# Patient Record
Sex: Female | Born: 1975 | Race: Black or African American | Hispanic: No | Marital: Married | Smoking: Current some day smoker
Health system: Southern US, Community
[De-identification: ages and names within clinical notes are randomized; demographics above are authoritative.]

## PROBLEM LIST (undated history)

## (undated) DIAGNOSIS — D649 Anemia, unspecified: Secondary | ICD-10-CM

## (undated) DIAGNOSIS — F419 Anxiety disorder, unspecified: Secondary | ICD-10-CM

## (undated) DIAGNOSIS — Z8489 Family history of other specified conditions: Secondary | ICD-10-CM

## (undated) DIAGNOSIS — J45909 Unspecified asthma, uncomplicated: Secondary | ICD-10-CM

## (undated) HISTORY — PX: APPENDECTOMY: SHX54

## (undated) HISTORY — PX: HERNIA REPAIR: SHX51

## (undated) HISTORY — PX: COLON SURGERY: SHX602

---

## 1998-04-30 HISTORY — PX: ECTOPIC PREGNANCY SURGERY: SHX613

## 2001-01-19 ENCOUNTER — Emergency Department (HOSPITAL_COMMUNITY): Admission: EM | Admit: 2001-01-19 | Discharge: 2001-01-19 | Payer: Self-pay | Admitting: *Deleted

## 2005-01-06 ENCOUNTER — Observation Stay (HOSPITAL_COMMUNITY): Admission: EM | Admit: 2005-01-06 | Discharge: 2005-01-07 | Payer: Self-pay | Admitting: Emergency Medicine

## 2006-01-02 ENCOUNTER — Emergency Department (HOSPITAL_COMMUNITY): Admission: EM | Admit: 2006-01-02 | Discharge: 2006-01-02 | Payer: Self-pay | Admitting: Emergency Medicine

## 2007-02-15 ENCOUNTER — Ambulatory Visit: Payer: Self-pay | Admitting: Obstetrics and Gynecology

## 2007-04-07 ENCOUNTER — Ambulatory Visit: Payer: Self-pay | Admitting: Obstetrics and Gynecology

## 2014-02-19 ENCOUNTER — Ambulatory Visit: Payer: Self-pay | Admitting: Family Medicine

## 2014-04-06 ENCOUNTER — Ambulatory Visit: Payer: Self-pay | Admitting: Specialist

## 2014-04-13 ENCOUNTER — Ambulatory Visit: Payer: Self-pay | Admitting: Family Medicine

## 2014-04-13 ENCOUNTER — Ambulatory Visit: Payer: Self-pay | Admitting: Specialist

## 2014-05-14 ENCOUNTER — Ambulatory Visit: Payer: Self-pay | Admitting: Oncology

## 2014-05-31 ENCOUNTER — Ambulatory Visit: Payer: Self-pay | Admitting: Oncology

## 2014-06-29 ENCOUNTER — Ambulatory Visit
Admit: 2014-06-29 | Disposition: A | Discharge status: Home / Self Care | Payer: Self-pay | Attending: Oncology | Admitting: Oncology

## 2014-07-13 ENCOUNTER — Ambulatory Visit: Payer: Self-pay | Admitting: Specialist

## 2014-07-20 ENCOUNTER — Inpatient Hospital Stay: Payer: Self-pay | Admitting: Specialist

## 2014-07-20 HISTORY — PX: LAPAROSCOPIC GASTRIC SLEEVE RESECTION: SHX5895

## 2014-08-23 LAB — SURGICAL PATHOLOGY

## 2014-08-29 NOTE — Op Note (Signed)
PATIENT NAME:  Catherine Hunter, COZBY MR#:  [id number] DATE OF BIRTH:  08-Dec-1975  DATE OF PROCEDURE:  07/20/2014  PREOPERATIVE DIAGNOSES: Morbid obesity and hiatal hernia.   POSTOPERATIVE DIAGNOSES: Morbid obesity and hiatal hernia.    PROCEDURE: Laparoscopic sleeve gastrectomy and a hiatal hernia repair.   SURGEON: Kreg Shropshire, M.D.   ASSISTANT: Valaria Good, PA-C.   COMPLICATIONS: None.   SPECIMENS: Portion of stomach.   ESTIMATED BLOOD LOSS: None.   ANESTHESIA: General endotracheal.   DRAINS: None.   CLINICAL HISTORY: See H and P.   DETAILS OF PROCEDURE: The patient was taken to the operating room and placed on the operating table in the supine position. Appropriate monitors and supplemental oxygen were delivered. Broad spectrum IV antibiotics were administered. The patient was placed under general anesthesia without incident. Sequential stockings were placed. The abdomen was prepped and draped in the usual sterile fashion.   Access using a 5 mm optical trocar. Pneumoperitoneum was established. Multiple other ports were placed in preparation for the surgery. The hiatus was explored.  A moderate hiatal hernia was evident. This was closed using interrupted 0 Surgidac sutures. Once this was closed, the greater curvature of the stomach was mobilized from 5 cm from the pylorus all the way to the fundus including the fundus off of the left hemidiaphragm.  The posterior pancreatic attachments were removed with the Harmonic scalpel as well, completely freeing up the undersurface of the stomach. A 36-French bougie was inserted into the antrum and the antrum was bisected using an Echelon reinforced green load stapler x 1. Multiple gold loads of Seamguard were used in a fashion parallel to the lesser curvature. The excess stomach was removed through a 15 mm port and sent for pathologic evaluation. The staple line was secure without any evidence of bleeding or leak. The trocars and the bougie were removed  without incident as well as the liver retractor, and the wounds were closed using 4-0 Vicryl and Dermabond.   The patient tolerated the procedure well and arrived to the recovery room in stable condition.  Patient will be admitted for 23 hours.    ____________________________ Kreg Shropshire, MD jb:sp D: 07/20/2014 10:17:26 ET T: 07/20/2014 12:17:00 ET JOB#: [id number]  cc: Kreg Shropshire, MD, <Dictator> Bonner Puna MD ELECTRONICALLY SIGNED 08/23/2014 9:39

## 2014-09-29 ENCOUNTER — Emergency Department: Payer: BLUE CROSS/BLUE SHIELD

## 2014-09-29 ENCOUNTER — Emergency Department
Admission: EM | Admit: 2014-09-29 | Discharge: 2014-09-29 | Disposition: A | Discharge status: Home / Self Care | Payer: BLUE CROSS/BLUE SHIELD | Attending: Emergency Medicine | Admitting: Emergency Medicine

## 2014-09-29 DIAGNOSIS — R112 Nausea with vomiting, unspecified: Secondary | ICD-10-CM | POA: Insufficient documentation

## 2014-09-29 DIAGNOSIS — Z3202 Encounter for pregnancy test, result negative: Secondary | ICD-10-CM | POA: Diagnosis not present

## 2014-09-29 DIAGNOSIS — Z79899 Other long term (current) drug therapy: Secondary | ICD-10-CM | POA: Diagnosis not present

## 2014-09-29 DIAGNOSIS — Z720 Tobacco use: Secondary | ICD-10-CM | POA: Insufficient documentation

## 2014-09-29 DIAGNOSIS — R109 Unspecified abdominal pain: Secondary | ICD-10-CM | POA: Insufficient documentation

## 2014-09-29 HISTORY — DX: Unspecified asthma, uncomplicated: J45.909

## 2014-09-29 HISTORY — DX: Anemia, unspecified: D64.9

## 2014-09-29 LAB — URINALYSIS COMPLETE WITH MICROSCOPIC (ARMC ONLY)
BACTERIA UA: NONE SEEN
BILIRUBIN URINE: NEGATIVE
Glucose, UA: NEGATIVE mg/dL
HGB URINE DIPSTICK: NEGATIVE
Leukocytes, UA: NEGATIVE
NITRITE: NEGATIVE
PH: 5 (ref 5.0–8.0)
PROTEIN: 30 mg/dL — AB
Specific Gravity, Urine: 1.027 (ref 1.005–1.030)

## 2014-09-29 LAB — CBC WITH DIFFERENTIAL/PLATELET
BASOS PCT: 1 %
Basophils Absolute: 0 10*3/uL (ref 0–0.1)
Eosinophils Absolute: 0.2 10*3/uL (ref 0–0.7)
Eosinophils Relative: 6 %
HCT: 34.9 % — ABNORMAL LOW (ref 35.0–47.0)
HEMOGLOBIN: 10.8 g/dL — AB (ref 12.0–16.0)
LYMPHS ABS: 0.6 10*3/uL — AB (ref 1.0–3.6)
Lymphocytes Relative: 21 %
MCH: 24.8 pg — AB (ref 26.0–34.0)
MCHC: 31 g/dL — AB (ref 32.0–36.0)
MCV: 80 fL (ref 80.0–100.0)
MONO ABS: 0.4 10*3/uL (ref 0.2–0.9)
Monocytes Relative: 15 %
NEUTROS ABS: 1.6 10*3/uL (ref 1.4–6.5)
NEUTROS PCT: 57 %
Platelets: 307 10*3/uL (ref 150–440)
RBC: 4.36 MIL/uL (ref 3.80–5.20)
RDW: 17.2 % — ABNORMAL HIGH (ref 11.5–14.5)
WBC: 2.9 10*3/uL — ABNORMAL LOW (ref 3.6–11.0)

## 2014-09-29 LAB — BASIC METABOLIC PANEL
ANION GAP: 7 (ref 5–15)
BUN: 6 mg/dL (ref 6–20)
CALCIUM: 8.9 mg/dL (ref 8.9–10.3)
CHLORIDE: 103 mmol/L (ref 101–111)
CO2: 26 mmol/L (ref 22–32)
Creatinine, Ser: 0.76 mg/dL (ref 0.44–1.00)
Glucose, Bld: 91 mg/dL (ref 65–99)
Potassium: 3.7 mmol/L (ref 3.5–5.1)
SODIUM: 136 mmol/L (ref 135–145)

## 2014-09-29 LAB — POCT PREGNANCY, URINE: PREG TEST UR: NEGATIVE

## 2014-09-29 MED ORDER — SODIUM CHLORIDE 0.9 % IV SOLN
Freq: Once | INTRAVENOUS | Status: AC
  Administered 2014-09-29: 12:00:00 via INTRAVENOUS

## 2014-09-29 MED ORDER — DIAZEPAM 5 MG PO TABS: 5.0000 mg | ORAL_TABLET | Freq: Three times a day (TID) | ORAL | Status: DC | PRN

## 2014-09-29 MED ORDER — ONDANSETRON HCL 4 MG/2ML IJ SOLN
4.0000 mg | Freq: Once | INTRAMUSCULAR | Status: AC
  Administered 2014-09-29: 4 mg via INTRAVENOUS

## 2014-09-29 MED ORDER — ONDANSETRON HCL 4 MG/2ML IJ SOLN
INTRAMUSCULAR | Status: AC
  Administered 2014-09-29: 4 mg via INTRAVENOUS
  Filled 2014-09-29: qty 2

## 2014-09-29 NOTE — ED Notes (Signed)
Pt c/o N/V with intermittent abd cramping for the past 2 days..states she had the gastric sleeve place in march of this year.

## 2014-09-29 NOTE — ED Notes (Signed)
Pt refused wheelchair at time of D/C. Ambulatory to the lobby at this time.

## 2014-09-29 NOTE — ED Notes (Signed)
NAD noted at time of D/C. Pt denies questions or concerns. Pt ambulatory to the lobby at this time.  

## 2014-09-29 NOTE — Discharge Instructions (Signed)

## 2014-09-29 NOTE — ED Provider Notes (Signed)
[facility] Emergency Department Provider Note     Time seen: ----------------------------------------- 11:05 AM on 09/29/2014 -----------------------------------------    I have reviewed the triage vital signs and the nursing notes.   HISTORY  Chief Complaint Nausea and Emesis    HPI Catherine Hunter is a 39 y.o. female since ER for nausea and vomiting with intermittent abdominal cramping for last 2 days. Patient states she had a gastric sleeve in March of this year. Patient states that since that period time she's been eating and drinking fine, no nausea vomiting. Presents with dentist or cramping of the abdomen for the past 2 days and poor by mouth tolerance. Nothing excessive symptoms better, vomiting symptoms worse.     Past Medical History  Diagnosis Date  . Asthma   . Anemia     There are no active problems to display for this patient.   Past Surgical History  Procedure Laterality Date  . Hernia repair    . Laparoscopic gastric sleeve resection      Current Outpatient Rx  Name  Route  Sig  Dispense  Refill  . cholecalciferol (VITAMIN D) 1000 UNITS tablet   Oral   Take 1,000 Units by mouth daily.         . Multiple Vitamins-Minerals (MULTIVITAMIN WITH MINERALS) tablet   Oral   Take 1 tablet by mouth daily.           Allergies Hydrocodone and Oxycodone  No family history on file.  Social History History  Substance Use Topics  . Smoking status: Current Some Day Smoker  . Smokeless tobacco: Never Used  . Alcohol Use: No    Review of Systems Constitutional: Negative for fever. Eyes: Negative for visual changes. ENT: Negative for sore throat. Cardiovascular: Negative for chest pain. Respiratory: Negative for shortness of breath. Gastrointestinal: Positive for abdominal pain and vomiting Genitourinary: Negative for dysuria. Musculoskeletal: Negative for back pain. Skin: Negative for rash. Neurological: Negative  for headaches, focal weakness or numbness.  10-point ROS otherwise negative.  ____________________________________________   PHYSICAL EXAM:  VITAL SIGNS: ED Triage Vitals  Enc Vitals Group     BP 09/29/14 1058 146/91 mmHg     Pulse Rate 09/29/14 1058 58     Resp 09/29/14 0829 20     Temp 09/29/14 0829 98.5 F (36.9 C)     Temp Source 09/29/14 0829 Oral     SpO2 09/29/14 0829 99 %     Weight 09/29/14 0829 172 lb (78.019 kg)     Height 09/29/14 0829 5\' 1"  (1.549 m)     Head Cir --      Peak Flow --      Pain Score 09/29/14 0841 2     Pain Loc --      Pain Edu? --      Excl. in [place]? --     Constitutional: Alert and oriented. Well appearing and in no distress. Eyes: Conjunctivae are normal. PERRL. Normal extraocular movements. ENT   Head: Normocephalic and atraumatic.   Nose: No congestion/rhinnorhea.   Mouth/Throat: Mucous membranes are moist.   Neck: No stridor. Hematological/Lymphatic/Immunilogical: No cervical lymphadenopathy. Cardiovascular: Normal rate, regular rhythm. Normal and symmetric distal pulses are present in all extremities. No murmurs, rubs, or gallops. Respiratory: Normal respiratory effort without tachypnea nor retractions. Breath sounds are clear and equal bilaterally. No wheezes/rales/rhonchi. Gastrointestinal: Soft and nontender. No distention. No abdominal bruits. There is no CVA tenderness. Musculoskeletal: Nontender with normal range of motion in  all extremities. No joint effusions.  No lower extremity tenderness nor edema. Neurologic:  Normal speech and language. No gross focal neurologic deficits are appreciated. Speech is normal. No gait instability. Skin:  Skin is warm, dry and intact. No rash noted. Psychiatric: Mood and affect are normal. Speech and behavior are normal. Patient exhibits appropriate insight and judgment.  ____________________________________________    LABS (pertinent positives/negatives)  Labs Reviewed  CBC WITH  DIFFERENTIAL/PLATELET - Abnormal; Notable for the following:    WBC 2.9 (*)    Hemoglobin 10.8 (*)    HCT 34.9 (*)    MCH 24.8 (*)    MCHC 31.0 (*)    RDW 17.2 (*)    Lymphs Abs 0.6 (*)    All other components within normal limits  URINALYSIS COMPLETEWITH MICROSCOPIC ([facility] ONLY) - Abnormal; Notable for the following:    Color, Urine AMBER (*)    APPearance CLEAR (*)    Ketones, ur 1+ (*)    Protein, ur 30 (*)    Squamous Epithelial / LPF 6-30 (*)    All other components within normal limits  BASIC METABOLIC PANEL  POC URINE PREG, ED  POCT PREGNANCY, URINE   ____________________________________________  ED COURSE:  Pertinent labs & imaging results that were available during my care of the patient were reviewed by me and considered in my medical decision making (see chart for details). We'll check abdominal labs as well as plain films. Likely unrelated to the gastric sleeve.  ____________________________________________   RADIOLOGY  Abdomen flat and erect is unremarkable FINDINGS: Staple line is present in the left upper quadrant related to prior sleeve gastrectomy. Gas is present in nondilated loops of small and large bowel throughout the abdomen without evidence of obstruction. Small amount of left-sided colonic stool is noted. Surgical clips and phleboliths are present in the pelvis. There is a 4.5 cm oval calcified lesion in the right hemipelvis. Slight S-shaped thoracolumbar scoliosis is noted.  IMPRESSION: 1. Nonobstructed bowel-gas pattern. 2. 4.5 cm partially calcified lesion in the right pelvis, indeterminate. Uterine fibroid or adnexal lesion are possible, and this could be further evaluated with nonemergent pelvic ultrasound or CT. Other considerations include changes related to prior surgery, such as a calcified fluid collection, or vascular/aneurysmal or overlying superficial soft  tissue calcification. ____________________________________________    FINAL ASSESSMENT AND PLAN  Abdominal pain and vomiting   Plan: Patient received saline hydration as well as Zofran. Labs are grossly unremarkable, stable for outpatient follow-up.    Earleen Newport, MD   Earleen Newport, MD 09/29/14 [phone]

## 2014-11-22 LAB — CBC WITH DIFFERENTIAL/PLATELET
BASOS ABS: 0 10*3/uL (ref 0.0–0.1)
BASOS PCT: 0.1 %
EOS ABS: 0 10*3/uL (ref 0.0–0.7)
EOS PCT: 0.2 %
HCT: 24.7 % — AB (ref 35.0–47.0)
HGB: 7.6 g/dL — AB (ref 12.0–16.0)
LYMPHS ABS: 0.9 10*3/uL — AB (ref 1.0–3.6)
Lymphocyte %: 13.1 %
MCH: 25.4 pg — ABNORMAL LOW (ref 26.0–34.0)
MCHC: 30.7 g/dL — ABNORMAL LOW (ref 32.0–36.0)
MCV: 83 fL (ref 80–100)
MONOS PCT: 10.1 %
Monocyte #: 0.7 x10 3/mm (ref 0.2–0.9)
Neutrophil #: 5.5 10*3/uL (ref 1.4–6.5)
Neutrophil %: 76.5 %
PLATELETS: 308 10*3/uL (ref 150–440)
RBC: 2.99 10*6/uL — AB (ref 3.80–5.20)
RDW: 17.4 % — AB (ref 11.5–14.5)
WBC: 7.2 10*3/uL (ref 3.6–11.0)

## 2014-11-22 LAB — BASIC METABOLIC PANEL
Anion Gap: 6 — ABNORMAL LOW (ref 7–16)
BUN: 7 mg/dL
Calcium, Total: 8.8 mg/dL — ABNORMAL LOW
Chloride: 103 mmol/L
Co2: 28 mmol/L
Creatinine: 0.73 mg/dL
EGFR (Non-African Amer.): >60
GLUCOSE: 109 mg/dL — AB
POTASSIUM: 3.7 mmol/L
Sodium: 137 mmol/L

## 2014-11-22 LAB — MAGNESIUM: Magnesium: 1.7 mg/dL

## 2014-11-22 LAB — PHOSPHORUS: PHOSPHORUS: 4 mg/dL

## 2014-12-01 ENCOUNTER — Emergency Department
Admission: EM | Admit: 2014-12-01 | Discharge: 2014-12-01 | Disposition: A | Discharge status: Home / Self Care | Payer: BLUE CROSS/BLUE SHIELD | Attending: Emergency Medicine | Admitting: Emergency Medicine

## 2014-12-01 ENCOUNTER — Encounter: Payer: Self-pay | Admitting: Emergency Medicine

## 2014-12-01 DIAGNOSIS — R112 Nausea with vomiting, unspecified: Secondary | ICD-10-CM | POA: Diagnosis present

## 2014-12-01 DIAGNOSIS — Z3202 Encounter for pregnancy test, result negative: Secondary | ICD-10-CM | POA: Insufficient documentation

## 2014-12-01 DIAGNOSIS — Z79899 Other long term (current) drug therapy: Secondary | ICD-10-CM | POA: Diagnosis not present

## 2014-12-01 DIAGNOSIS — R110 Nausea: Secondary | ICD-10-CM | POA: Diagnosis not present

## 2014-12-01 DIAGNOSIS — Z720 Tobacco use: Secondary | ICD-10-CM | POA: Diagnosis not present

## 2014-12-01 DIAGNOSIS — E8889 Other specified metabolic disorders: Secondary | ICD-10-CM | POA: Diagnosis not present

## 2014-12-01 LAB — URINALYSIS COMPLETE WITH MICROSCOPIC (ARMC ONLY)
BILIRUBIN URINE: NEGATIVE
GLUCOSE, UA: NEGATIVE mg/dL
LEUKOCYTES UA: NEGATIVE
Nitrite: NEGATIVE
PH: 5 (ref 5.0–8.0)
Protein, ur: 30 mg/dL — AB
Specific Gravity, Urine: 1.025 (ref 1.005–1.030)

## 2014-12-01 LAB — COMPREHENSIVE METABOLIC PANEL
ALBUMIN: 3.4 g/dL — AB (ref 3.5–5.0)
ALK PHOS: 85 U/L (ref 38–126)
ALT: 15 U/L (ref 14–54)
ANION GAP: 10 (ref 5–15)
AST: 18 U/L (ref 15–41)
BILIRUBIN TOTAL: 0.4 mg/dL (ref 0.3–1.2)
BUN: 7 mg/dL (ref 6–20)
CHLORIDE: 107 mmol/L (ref 101–111)
CO2: 24 mmol/L (ref 22–32)
Calcium: 8.4 mg/dL — ABNORMAL LOW (ref 8.9–10.3)
Creatinine, Ser: 0.56 mg/dL (ref 0.44–1.00)
GFR calc Af Amer: >60 mL/min (ref >60)
GLUCOSE: 95 mg/dL (ref 65–99)
Potassium: 3.4 mmol/L — ABNORMAL LOW (ref 3.5–5.1)
SODIUM: 141 mmol/L (ref 135–145)
Total Protein: 7.3 g/dL (ref 6.5–8.1)

## 2014-12-01 LAB — CBC WITH DIFFERENTIAL/PLATELET
BASOS ABS: 0 10*3/uL (ref 0–0.1)
Basophils Relative: 1 %
EOS ABS: 0 10*3/uL (ref 0–0.7)
EOS PCT: 1 %
HCT: 30.7 % — ABNORMAL LOW (ref 35.0–47.0)
HEMOGLOBIN: 9.6 g/dL — AB (ref 12.0–16.0)
LYMPHS ABS: 0.3 10*3/uL — AB (ref 1.0–3.6)
Lymphocytes Relative: 12 %
MCH: 24.1 pg — AB (ref 26.0–34.0)
MCHC: 31.3 g/dL — AB (ref 32.0–36.0)
MCV: 76.9 fL — ABNORMAL LOW (ref 80.0–100.0)
MONO ABS: 0.2 10*3/uL (ref 0.2–0.9)
Monocytes Relative: 8 %
NEUTROS ABS: 1.7 10*3/uL (ref 1.4–6.5)
NEUTROS PCT: 78 %
PLATELETS: 305 10*3/uL (ref 150–440)
RBC: 3.99 MIL/uL (ref 3.80–5.20)
RDW: 17.8 % — AB (ref 11.5–14.5)
WBC: 2.2 10*3/uL — AB (ref 3.6–11.0)

## 2014-12-01 LAB — PREGNANCY, URINE: Preg Test, Ur: NEGATIVE

## 2014-12-01 LAB — LIPASE, BLOOD: Lipase: 37 U/L (ref 22–51)

## 2014-12-01 MED ORDER — ONDANSETRON HCL 4 MG/2ML IJ SOLN
4.0000 mg | Freq: Once | INTRAMUSCULAR | Status: AC
  Administered 2014-12-01: 4 mg via INTRAVENOUS
  Filled 2014-12-01: qty 2

## 2014-12-01 MED ORDER — DEXTROSE 5 % AND 0.9 % NACL IV BOLUS
500.0000 mL | Freq: Once | INTRAVENOUS | Status: AC
  Administered 2014-12-01: 500 mL via INTRAVENOUS
  Filled 2014-12-01: qty 500

## 2014-12-01 MED ORDER — ONDANSETRON HCL 4 MG PO TABS: 4.0000 mg | ORAL_TABLET | Freq: Every day | ORAL | Status: DC | PRN

## 2014-12-01 NOTE — ED Notes (Signed)
Patient had gastric sleeve surgery 3/22/6.  Patient states she has been vomiting since 4 am but hasn't been able to eat regularly since last Friday.

## 2014-12-01 NOTE — ED Provider Notes (Signed)
[facility] Emergency Department Provider Note  ____________________________________________  Time seen: 64  I have reviewed the triage vital signs and the nursing notes.   HISTORY  Chief Complaint Emesis     HPI Catherine Hunter is a 39 y.o. female who had a gastric sleeve placed in March of this year. She has been doing well until this past Friday when she began to have nausea and vomiting. The nausea and vomiting has continued. She has not been able to eat well over the past few days. She is not having any upper abdominal pain. She denies any fever. She has not had any diarrhea. Her last bowel movement was 2 days ago but she has been flatulent in her normal manner. She is having some lower abdominal discomfort, she reports, from her current menses.    Past Medical History  Diagnosis Date  . Asthma   . Anemia     There are no active problems to display for this patient.   Past Surgical History  Procedure Laterality Date  . Hernia repair    . Laparoscopic gastric sleeve resection      Current Outpatient Rx  Name  Route  Sig  Dispense  Refill  . cholecalciferol (VITAMIN D) 1000 UNITS tablet   Oral   Take 1,000 Units by mouth daily.         . Multiple Vitamins-Minerals (MULTIVITAMIN WITH MINERALS) tablet   Oral   Take 1 tablet by mouth daily.         . ondansetron (ZOFRAN) 4 MG tablet   Oral   Take 1 tablet (4 mg total) by mouth daily as needed for nausea or vomiting.   16 tablet   0     Allergies Hydrocodone and Oxycodone  No family history on file.  Social History History  Substance Use Topics  . Smoking status: Current Some Day Smoker  . Smokeless tobacco: Never Used  . Alcohol Use: Yes    Review of Systems  Constitutional: Negative for fever. ENT: Negative for sore throat. Cardiovascular: Negative for chest pain. Respiratory: Negative for shortness of breath. Gastrointestinal: Nausea and vomiting without  diarrhea. See history of present illness Genitourinary: Negative for dysuria. Positive for menstrual cramps. Musculoskeletal: No myalgias or injuries. Skin: Negative for rash. Neurological: Negative for headaches   10-point ROS otherwise negative.  ____________________________________________   PHYSICAL EXAM:  VITAL SIGNS: ED Triage Vitals  Enc Vitals Group     BP 12/01/14 0906 132/85 mmHg     Pulse Rate 12/01/14 0906 101     Resp 12/01/14 0906 20     Temp 12/01/14 0908 98.1 F (36.7 C)     Temp Source 12/01/14 0908 Oral     SpO2 12/01/14 0906 98 %     Weight 12/01/14 0906 162 lb (73.483 kg)     Height 12/01/14 0906 5\' 5"  (1.651 m)     Head Cir --      Peak Flow --      Pain Score 12/01/14 0917 5     Pain Loc --      Pain Edu? --      Excl. in [place]? --     Constitutional: Alert and oriented. Patient appears a little uncomfortable but no acute distress. ENT   Head: Normocephalic and atraumatic.   Nose: No congestion/rhinnorhea.   Mouth/Throat: Mucous membranes are moist. Cardiovascular: Normal rate, regular rhythm, no murmur noted Respiratory:  Normal respiratory effort, no tachypnea.    Breath  sounds are clear and equal bilaterally.  Gastrointestinal: Soft. Mild tenderness in the lower abdomen due to menstrual cramps per the patient. No tenderness in the upper abdomen. No distention.  Back: No muscle spasm, no tenderness, no CVA tenderness. Musculoskeletal: No deformity noted. Nontender with normal range of motion in all extremities.  No noted edema. Neurologic:  Normal speech and language. No gross focal neurologic deficits are appreciated.  Skin:  Skin is warm, dry. No rash noted. Psychiatric: Mood and affect are normal. Speech and behavior are normal.  ____________________________________________    LABS (pertinent positives/negatives)  Labs Reviewed  CBC WITH DIFFERENTIAL/PLATELET - Abnormal; Notable for the following:    WBC 2.2 (*)    Hemoglobin  9.6 (*)    HCT 30.7 (*)    MCV 76.9 (*)    MCH 24.1 (*)    MCHC 31.3 (*)    RDW 17.8 (*)    Lymphs Abs 0.3 (*)    All other components within normal limits  COMPREHENSIVE METABOLIC PANEL - Abnormal; Notable for the following:    Potassium 3.4 (*)    Calcium 8.4 (*)    Albumin 3.4 (*)    All other components within normal limits  URINALYSIS COMPLETEWITH MICROSCOPIC ([facility] ONLY) - Abnormal; Notable for the following:    Color, Urine YELLOW (*)    APPearance CLEAR (*)    Ketones, ur 1+ (*)    Hgb urine dipstick 2+ (*)    Protein, ur 30 (*)    Bacteria, UA RARE (*)    Squamous Epithelial / LPF 0-5 (*)    All other components within normal limits  LIPASE, BLOOD  PREGNANCY, URINE     ____________________________________________ _______________________   INITIAL IMPRESSION / ASSESSMENT AND PLAN / ED COURSE  Pertinent labs & imaging results that were available during my care of the patient were reviewed by me and considered in my medical decision making (see chart for details).  Pleasant 39 year old female who looks somewhat uncomfortable but in no acute distress, with nausea and vomiting and decreased by mouth intake for the past 5 days. She is also having menstrual cramps. She had a gastric sleeve placed last March. This was done by Dr. Darnell Level here at [place] are pending. We will treat her with Zofran for nausea. The patient and I have agreed that pain medications are not indicated currently.  ----------------------------------------- 11:13 PM on 12/01/2014 -----------------------------------------  Labs are overall reasonable. She does have 1+ ketones in her urine, likely due to the lack of by mouth intake over the last 3-4 days. She has a low white blood cell count at 2.2. Her electrolytes are reasonable.  I have called Dr. Synthia Innocent office and spoke with someone, giving detailed information about the patient's symptoms and concerns today. They have told me that  they will have him call us back.  ----------------------------------------- 1:49 PM on 12/01/2014 -----------------------------------------  Case discussed with Valaria Good, a physician assistant for Dr. Darnell Level. She reports that with no upper abdominal pain and with the gastric sleeve, it would be unlikely to see a complication on CT and a CT is not indicated unless there were other concerns. I do not see any indication for CT given this discussion.  Reassessment of the patient finds her overall comfortable. Her nausea was under control after receiving Zofran earlier, though her nausea is beginning to come back now. We will treat her with more Zofran and have a by mouth challenge.  ----------------------------------------- 3:15 PM on  12/01/2014 -----------------------------------------  Patient feels better. She is tolerating water by mouth, but she is reluctant to try any foods. I've encouraged her to do this and she is now willing to try. I provided her with some applesauce and graham crackers. We'll see how she tolerates this.  ----------------------------------------- 3:41 PM on 12/01/2014 -----------------------------------------  Patient tolerating foods by mouth comfortably. We'll discharge her home. She can follow-up with her regular doctor as well as with Dr. Darnell Level.  ____________________________________________   FINAL CLINICAL IMPRESSION(S) / ED DIAGNOSES  Final diagnoses:  Nausea  Ketosis      Ahmed Prima, MD 12/01/14 [phone]

## 2014-12-01 NOTE — Discharge Instructions (Signed)
It is not clear what is causing your nausea. We have treated with some antinausea medicine. Please continue with Zofran as needed.  We discussed her situation with Dr. Darnell Level as well as with his PA Reather Laurence. Please follow-up with them either this Tuesday here in [place] or in [place] sooner. Return to the emergency department if you have worsening abdominal pain or other urgent concerns.  Nausea and Vomiting Nausea means you feel sick to your stomach. Throwing up (vomiting) is a reflex where stomach contents come out of your mouth. HOME CARE   Take medicine as told by your doctor.  Do not force yourself to eat. However, you do need to drink fluids.  If you feel like eating, eat a normal diet as told by your doctor.  Eat rice, wheat, potatoes, bread, lean meats, yogurt, fruits, and vegetables.  Avoid high-fat foods.  Drink enough fluids to keep your pee (urine) clear or pale yellow.  Ask your doctor how to replace body fluid losses (rehydrate). Signs of body fluid loss (dehydration) include:  Feeling very thirsty.  Dry lips and mouth.  Feeling dizzy.  Dark pee.  Peeing less than normal.  Feeling confused.  Fast breathing or heart rate. GET HELP RIGHT AWAY IF:   You have blood in your throw up.  You have black or bloody poop (stool).  You have a bad headache or stiff neck.  You feel confused.  You have bad belly (abdominal) pain.  You have chest pain or trouble breathing.  You do not pee at least once every 8 hours.  You have cold, clammy skin.  You keep throwing up after 24 to 48 hours.  You have a fever. MAKE SURE YOU:   Understand these instructions.  Will watch your condition.  Will get help right away if you are not doing well or get worse. Document Released: 10/03/2007 Document Revised: 07/09/2011 Document Reviewed: 09/15/2010 [facility] Patient Information 2015 [place], [place]. This information is not intended to replace advice given to you by your  health care provider. Make sure you discuss any questions you have with your health care provider.

## 2014-12-01 NOTE — ED Notes (Signed)
MD at bedside. 

## 2014-12-01 NOTE — ED Notes (Signed)
Pt presents with vomiting since 4am. Pt recently had had gastric sleeve surgery back in March. Denies any pain or diarrhea.

## 2015-10-06 ENCOUNTER — Encounter: Payer: Self-pay | Admitting: Emergency Medicine

## 2015-10-06 ENCOUNTER — Emergency Department
Admission: EM | Admit: 2015-10-06 | Discharge: 2015-10-06 | Disposition: A | Discharge status: Home / Self Care | Payer: BLUE CROSS/BLUE SHIELD | Attending: Student | Admitting: Student

## 2015-10-06 ENCOUNTER — Emergency Department: Payer: BLUE CROSS/BLUE SHIELD

## 2015-10-06 DIAGNOSIS — F17200 Nicotine dependence, unspecified, uncomplicated: Secondary | ICD-10-CM | POA: Insufficient documentation

## 2015-10-06 DIAGNOSIS — K5900 Constipation, unspecified: Secondary | ICD-10-CM | POA: Diagnosis not present

## 2015-10-06 DIAGNOSIS — J45909 Unspecified asthma, uncomplicated: Secondary | ICD-10-CM | POA: Insufficient documentation

## 2015-10-06 DIAGNOSIS — R1031 Right lower quadrant pain: Secondary | ICD-10-CM | POA: Diagnosis present

## 2015-10-06 DIAGNOSIS — D72819 Decreased white blood cell count, unspecified: Secondary | ICD-10-CM

## 2015-10-06 DIAGNOSIS — Z79899 Other long term (current) drug therapy: Secondary | ICD-10-CM | POA: Insufficient documentation

## 2015-10-06 LAB — COMPREHENSIVE METABOLIC PANEL
ALK PHOS: 56 U/L (ref 38–126)
ALT: 12 U/L — AB (ref 14–54)
AST: 15 U/L (ref 15–41)
Albumin: 3.8 g/dL (ref 3.5–5.0)
Anion gap: 7 (ref 5–15)
BILIRUBIN TOTAL: 0.5 mg/dL (ref 0.3–1.2)
BUN: 10 mg/dL (ref 6–20)
CALCIUM: 8.5 mg/dL — AB (ref 8.9–10.3)
CHLORIDE: 109 mmol/L (ref 101–111)
CO2: 23 mmol/L (ref 22–32)
CREATININE: 0.6 mg/dL (ref 0.44–1.00)
Glucose, Bld: 74 mg/dL (ref 65–99)
Potassium: 3.7 mmol/L (ref 3.5–5.1)
Sodium: 139 mmol/L (ref 135–145)
TOTAL PROTEIN: 7.5 g/dL (ref 6.5–8.1)

## 2015-10-06 LAB — URINALYSIS COMPLETE WITH MICROSCOPIC (ARMC ONLY)
BILIRUBIN URINE: NEGATIVE
Bacteria, UA: NONE SEEN
Glucose, UA: NEGATIVE mg/dL
Hgb urine dipstick: NEGATIVE
KETONES UR: NEGATIVE mg/dL
Leukocytes, UA: NEGATIVE
Nitrite: NEGATIVE
PH: 5 (ref 5.0–8.0)
Protein, ur: NEGATIVE mg/dL
Specific Gravity, Urine: 1.019 (ref 1.005–1.030)

## 2015-10-06 LAB — CBC WITH DIFFERENTIAL/PLATELET
BASOS ABS: 0 10*3/uL (ref 0–0.1)
Basophils Relative: 2 %
Eosinophils Absolute: 0.1 10*3/uL (ref 0–0.7)
HEMATOCRIT: 28.8 % — AB (ref 35.0–47.0)
HEMOGLOBIN: 9 g/dL — AB (ref 12.0–16.0)
LYMPHS ABS: 1.1 10*3/uL (ref 1.0–3.6)
MCH: 22.9 pg — AB (ref 26.0–34.0)
MCHC: 31.4 g/dL — ABNORMAL LOW (ref 32.0–36.0)
MCV: 72.8 fL — AB (ref 80.0–100.0)
Monocytes Absolute: 0.5 10*3/uL (ref 0.2–0.9)
Monocytes Relative: 19 %
NEUTROS ABS: 1 10*3/uL — AB (ref 1.4–6.5)
Neutrophils Relative %: 37 %
PLATELETS: 300 10*3/uL (ref 150–440)
RBC: 3.95 MIL/uL (ref 3.80–5.20)
RDW: 18.2 % — ABNORMAL HIGH (ref 11.5–14.5)
WBC: 2.6 10*3/uL — AB (ref 3.6–11.0)

## 2015-10-06 LAB — POCT PREGNANCY, URINE: Preg Test, Ur: NEGATIVE

## 2015-10-06 LAB — LIPASE, BLOOD: LIPASE: 42 U/L (ref 11–51)

## 2015-10-06 MED ORDER — MAGNESIUM CITRATE PO SOLN: 1.0000 | Freq: Once | ORAL | Status: DC

## 2015-10-06 MED ORDER — DIATRIZOATE MEGLUMINE & SODIUM 66-10 % PO SOLN: 15.0000 mL | Freq: Once | ORAL | Status: DC

## 2015-10-06 MED ORDER — ONDANSETRON HCL 4 MG/2ML IJ SOLN
4.0000 mg | Freq: Once | INTRAMUSCULAR | Status: DC
  Filled 2015-10-06: qty 2

## 2015-10-06 MED ORDER — SODIUM CHLORIDE 0.9 % IV BOLUS (SEPSIS): 1000.0000 mL | Freq: Once | INTRAVENOUS | Status: DC

## 2015-10-06 NOTE — ED Provider Notes (Signed)
[facility] Emergency Department Provider Note   ____________________________________________  Time seen: Approximately 8:40 AM  I have reviewed the triage vital signs and the nursing notes.   HISTORY  Chief Complaint Abdominal Pain    HPI Catherine Hunter is a 40 y.o. female with history of asthma, anemia, history of gastric sleeve operation approximately one year ago who presents for evaluation of 3 days of right lower quadrant pain, gradual onset, constant, no modifying factors, currently moderate. No nausea vomiting or diarrhea, she has had constipation. She reports history of constipation which is more severe over the past 2 weeks, only having one small bowel movement in the past 2 weeks despite taking MiraLAX twice a day. No fevers or chills. No chest pain or difficulty breathing. Abdominal surgical history is also significant for C-section as well as hernia repair and she reports surgical repair of a tubal pregnancy. She denies any abnormal vaginal bleeding or vaginal discharge, she denies any concern for sex or chest infection, she is in a mutually monogamous sexual relationship with her husband of several years.   Past Medical History  Diagnosis Date  . Asthma   . Anemia     There are no active problems to display for this patient.   Past Surgical History  Procedure Laterality Date  . Hernia repair    . Laparoscopic gastric sleeve resection      Current Outpatient Rx  Name  Route  Sig  Dispense  Refill  . cholecalciferol (VITAMIN D) 1000 UNITS tablet   Oral   Take 1,000 Units by mouth daily.         . Multiple Vitamins-Minerals (MULTIVITAMIN WITH MINERALS) tablet   Oral   Take 1 tablet by mouth daily.         . ondansetron (ZOFRAN) 4 MG tablet   Oral   Take 1 tablet (4 mg total) by mouth daily as needed for nausea or vomiting.   16 tablet   0     Allergies Hydrocodone and Oxycodone  No family history on file.  Social  History Social History  Substance Use Topics  . Smoking status: Current Some Day Smoker  . Smokeless tobacco: Never Used  . Alcohol Use: Yes    Review of Systems Constitutional: No fever/chills Eyes: No visual changes. ENT: No sore throat. Cardiovascular: Denies chest pain. Respiratory: Denies shortness of breath. Gastrointestinal: + abdominal pain.  No nausea, no vomiting.  No diarrhea.  + constipation. Genitourinary: Negative for dysuria. Musculoskeletal: Negative for back pain. Skin: Negative for rash. Neurological: Negative for headaches, focal weakness or numbness.  10-point ROS otherwise negative.  ____________________________________________   PHYSICAL EXAM:  VITAL SIGNS: ED Triage Vitals  Enc Vitals Group     BP --      Pulse --      Resp --      Temp --      Temp src --      SpO2 --      Weight 10/06/15 0832 143 lb (64.864 kg)     Height 10/06/15 0832 5\' 1"  (1.549 m)     Head Cir --      Peak Flow --      Pain Score 10/06/15 0833 6     Pain Loc --      Pain Edu? --      Excl. in [place]? --     Constitutional: Alert and oriented. Well appearing and in no acute distress. Eyes: Conjunctivae are  normal. PERRL. EOMI. Head: Atraumatic. Nose: No congestion/rhinnorhea. Mouth/Throat: Mucous membranes are moist.  Oropharynx non-erythematous. Neck: No stridor.   Cardiovascular: Normal rate, regular rhythm. Grossly normal heart sounds.  Good peripheral circulation. Respiratory: Normal respiratory effort.  No retractions. Lungs CTAB. Gastrointestinal: Soft With mild tenderness to palpation in the right lower quadrant and right midabdomen, no rebound or guarding. No CVA tenderness. Genitourinary: deferred Musculoskeletal: No lower extremity tenderness nor edema.  No joint effusions. Neurologic:  Normal speech and language. No gross focal neurologic deficits are appreciated. No gait instability. Skin:  Skin is warm, dry and intact. No rash noted. Psychiatric: Mood and  affect are normal. Speech and behavior are normal.  ____________________________________________   LABS (all labs ordered are listed, but only abnormal results are displayed)  Labs Reviewed  CBC WITH DIFFERENTIAL/PLATELET - Abnormal; Notable for the following:    WBC 2.6 (*)    Hemoglobin 9.0 (*)    HCT 28.8 (*)    MCV 72.8 (*)    MCH 22.9 (*)    MCHC 31.4 (*)    RDW 18.2 (*)    Neutro Abs 1.0 (*)    All other components within normal limits  COMPREHENSIVE METABOLIC PANEL - Abnormal; Notable for the following:    Calcium 8.5 (*)    ALT 12 (*)    All other components within normal limits  URINALYSIS COMPLETEWITH MICROSCOPIC ([facility] ONLY) - Abnormal; Notable for the following:    Color, Urine YELLOW (*)    APPearance CLEAR (*)    Squamous Epithelial / LPF 0-5 (*)    All other components within normal limits  LIPASE, BLOOD  POC URINE PREG, ED  POCT PREGNANCY, URINE   ____________________________________________  EKG  none ____________________________________________  RADIOLOGY  3 view abdominal plain films IMPRESSION: No bowel obstruction or free air. Moderate stool in colon. 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 No lung edema or consolidation. ____________________________________________   PROCEDURES  Procedure(s) performed: None  Critical Care performed: No  ____________________________________________   INITIAL IMPRESSION / ASSESSMENT AND PLAN / ED COURSE  Pertinent labs & imaging results that were available during my care of the patient were reviewed by me and considered in my medical decision making (see chart for details).  Catherine Hunter is a 40 y.o. female with history of asthma, anemia, history of gastric sleeve operation approximately one year ago who presents for evaluation of 3 days of right lower quadrant pain. On exam, she is very well-appearing and in no acute distress, vital signs are stable and she is afebrile.** She does have  faint tenderness to palpation throughout the right lower quadrant and the right midabdomen. She denies concern for sexually transmitted infection, appears well clinically and I do not think that this is consistent with torsion or PID. I discussed with her that because of her tenderness in the right lower quadrant, we should obtain a CT scan of the abdomen and pelvis to rule out appendicitis. Initially she agreed to this however she has since changed her mind reporting that she does not want to get the CT scan because she feels as if this is all secondary to constipation, she is claustrophobic and is worried about going to the CT scan though I have offered medications for and she'll ice this, she is concerned about cost. I told her she is well within her right to refuse the CT scan, we will obtain plain films of the abdomen to rule out obstruction and evaluate stool burden and we  are awaiting labs. She has refused any treatment for pain, IV fluids or Zofran at this time.  ----------------------------------------- 11:36 AM on 10/06/2015 ----------------------------------------- Patient continues to appear well. I reviewed her labs. CBC is notable for chronic mild leukopenia and chronic anemia which is generally unchanged from prior. Unremarkable CMP and lipase, negative pregnancy test. Urinalysis is not consistent with infection. 3 view plain films showed no obstruction or free air, moderate stool is noted, there is possibly a calcified uterine leiomyoma in the right pelvis. She reports that she wants to go home. I discussed I would send her home with magnesium citrate but if after she had a good bowel movement, her pain was persistent in the right lower quadrant or she had any additional concerning symptoms, discussed that she would return immediately for CT scan and further evaluation. She voices understanding and is comfortable with the discharge plan. DC  home.  ____________________________________________   FINAL CLINICAL IMPRESSION(S) / ED DIAGNOSES  Final diagnoses:  RLQ abdominal pain  Constipation, unspecified constipation type      NEW MEDICATIONS STARTED DURING THIS VISIT:  New Prescriptions   No medications on file     Note:  This document was prepared using Dragon voice recognition software and may include unintentional dictation errors.    Joanne Gavel, MD 10/06/15 1143

## 2015-10-06 NOTE — ED Notes (Signed)
Pt back from x-ray.

## 2015-10-06 NOTE — ED Notes (Signed)
Pt states abd pain worsening over the past few days, no bm for 2 weeks now, tried miralax with no relief. Had gastric sleeve a year ago.

## 2015-10-06 NOTE — ED Notes (Signed)
Pt reports to this RN that she wants to discuss CT scan with edp. Pt also does not want IV fluids and IV zofran that was ordered until she speaks with the edp. edp notified.

## 2015-10-06 NOTE — ED Notes (Signed)
Patient transported to X-ray 

## 2016-04-28 ENCOUNTER — Inpatient Hospital Stay: Payer: BLUE CROSS/BLUE SHIELD

## 2016-04-28 ENCOUNTER — Emergency Department: Payer: BLUE CROSS/BLUE SHIELD

## 2016-04-28 ENCOUNTER — Inpatient Hospital Stay
Admission: EM | Admit: 2016-04-28 | Discharge: 2016-05-03 | DRG: 336 | Disposition: A | Discharge status: Home / Self Care | Payer: BLUE CROSS/BLUE SHIELD | Attending: Surgery | Admitting: Surgery

## 2016-04-28 ENCOUNTER — Encounter: Payer: Self-pay | Admitting: Radiology

## 2016-04-28 ENCOUNTER — Emergency Department
Admission: EM | Admit: 2016-04-28 | Discharge: 2016-04-28 | Disposition: A | Discharge status: Home / Self Care | Payer: BLUE CROSS/BLUE SHIELD | Source: Home / Self Care

## 2016-04-28 DIAGNOSIS — Z9884 Bariatric surgery status: Secondary | ICD-10-CM | POA: Diagnosis not present

## 2016-04-28 DIAGNOSIS — D259 Leiomyoma of uterus, unspecified: Secondary | ICD-10-CM | POA: Diagnosis present

## 2016-04-28 DIAGNOSIS — K5651 Intestinal adhesions [bands], with partial obstruction: Secondary | ICD-10-CM | POA: Diagnosis present

## 2016-04-28 DIAGNOSIS — J45909 Unspecified asthma, uncomplicated: Secondary | ICD-10-CM

## 2016-04-28 DIAGNOSIS — K56609 Unspecified intestinal obstruction, unspecified as to partial versus complete obstruction: Secondary | ICD-10-CM | POA: Diagnosis present

## 2016-04-28 DIAGNOSIS — F17200 Nicotine dependence, unspecified, uncomplicated: Secondary | ICD-10-CM | POA: Diagnosis present

## 2016-04-28 DIAGNOSIS — R110 Nausea: Secondary | ICD-10-CM | POA: Insufficient documentation

## 2016-04-28 DIAGNOSIS — R188 Other ascites: Secondary | ICD-10-CM | POA: Diagnosis present

## 2016-04-28 DIAGNOSIS — Z885 Allergy status to narcotic agent status: Secondary | ICD-10-CM | POA: Diagnosis not present

## 2016-04-28 DIAGNOSIS — Z5321 Procedure and treatment not carried out due to patient leaving prior to being seen by health care provider: Secondary | ICD-10-CM | POA: Insufficient documentation

## 2016-04-28 DIAGNOSIS — R1084 Generalized abdominal pain: Secondary | ICD-10-CM

## 2016-04-28 DIAGNOSIS — Z4659 Encounter for fitting and adjustment of other gastrointestinal appliance and device: Secondary | ICD-10-CM

## 2016-04-28 HISTORY — DX: Unspecified intestinal obstruction, unspecified as to partial versus complete obstruction: K56.609

## 2016-04-28 LAB — URINALYSIS, COMPLETE (UACMP) WITH MICROSCOPIC
BACTERIA UA: NONE SEEN
BILIRUBIN URINE: NEGATIVE
Glucose, UA: NEGATIVE mg/dL
Hgb urine dipstick: NEGATIVE
KETONES UR: 5 mg/dL — AB
Leukocytes, UA: NEGATIVE
Nitrite: NEGATIVE
Protein, ur: 30 mg/dL — AB
Specific Gravity, Urine: 1.025 (ref 1.005–1.030)
pH: 7 (ref 5.0–8.0)

## 2016-04-28 LAB — CBC
HCT: 28.5 % — ABNORMAL LOW (ref 35.0–47.0)
Hemoglobin: 8.6 g/dL — ABNORMAL LOW (ref 12.0–16.0)
MCH: 20.4 pg — ABNORMAL LOW (ref 26.0–34.0)
MCHC: 30 g/dL — ABNORMAL LOW (ref 32.0–36.0)
MCV: 68 fL — AB (ref 80.0–100.0)
PLATELETS: 363 10*3/uL (ref 150–440)
RBC: 4.19 MIL/uL (ref 3.80–5.20)
RDW: 20.5 % — ABNORMAL HIGH (ref 11.5–14.5)
WBC: 6.2 10*3/uL (ref 3.6–11.0)

## 2016-04-28 LAB — COMPREHENSIVE METABOLIC PANEL
ALBUMIN: 4 g/dL (ref 3.5–5.0)
ALT: 14 U/L (ref 14–54)
AST: 26 U/L (ref 15–41)
Alkaline Phosphatase: 55 U/L (ref 38–126)
Anion gap: 9 (ref 5–15)
BUN: 9 mg/dL (ref 6–20)
CHLORIDE: 104 mmol/L (ref 101–111)
CO2: 21 mmol/L — AB (ref 22–32)
CREATININE: 0.6 mg/dL (ref 0.44–1.00)
Calcium: 9.4 mg/dL (ref 8.9–10.3)
GFR calc Af Amer: >60 mL/min (ref >60)
GLUCOSE: 139 mg/dL — AB (ref 65–99)
Potassium: 3.6 mmol/L (ref 3.5–5.1)
SODIUM: 134 mmol/L — AB (ref 135–145)
Total Bilirubin: 0.5 mg/dL (ref 0.3–1.2)
Total Protein: 7.9 g/dL (ref 6.5–8.1)

## 2016-04-28 LAB — LIPASE, BLOOD: Lipase: 24 U/L (ref 11–51)

## 2016-04-28 LAB — PREGNANCY, URINE: PREG TEST UR: NEGATIVE

## 2016-04-28 MED ORDER — IOPAMIDOL (ISOVUE-300) INJECTION 61%
100.0000 mL | Freq: Once | INTRAVENOUS | Status: AC | PRN
  Administered 2016-04-28: 100 mL via INTRAVENOUS

## 2016-04-28 MED ORDER — PROMETHAZINE HCL 25 MG/ML IJ SOLN
12.5000 mg | Freq: Once | INTRAMUSCULAR | Status: AC
  Administered 2016-04-28: 12.5 mg via INTRAVENOUS
  Filled 2016-04-28: qty 1

## 2016-04-28 MED ORDER — LACTATED RINGERS IV BOLUS (SEPSIS)
1000.0000 mL | Freq: Once | INTRAVENOUS | Status: AC
  Administered 2016-04-28: 1000 mL via INTRAVENOUS

## 2016-04-28 MED ORDER — SODIUM CHLORIDE 0.9 % IV BOLUS (SEPSIS)
1000.0000 mL | Freq: Once | INTRAVENOUS | Status: AC
  Administered 2016-04-28: 1000 mL via INTRAVENOUS

## 2016-04-28 MED ORDER — PHENOL 1.4 % MT LIQD
1.0000 | OROMUCOSAL | Status: DC | PRN
Start: 2016-04-28 — End: 2016-05-03
  Administered 2016-04-28: 1 via OROMUCOSAL
  Filled 2016-04-28: qty 177

## 2016-04-28 MED ORDER — KCL IN DEXTROSE-NACL 20-5-0.45 MEQ/L-%-% IV SOLN
INTRAVENOUS | Status: DC
  Administered 2016-04-28 – 2016-05-02 (×7): via INTRAVENOUS
  Filled 2016-04-28 (×12): qty 1000

## 2016-04-28 MED ORDER — ONDANSETRON HCL 4 MG PO TABS: 4.0000 mg | ORAL_TABLET | Freq: Four times a day (QID) | ORAL | Status: DC | PRN

## 2016-04-28 MED ORDER — IOPAMIDOL (ISOVUE-300) INJECTION 61%
15.0000 mL | INTRAVENOUS | Status: DC
  Administered 2016-04-28: 15 mL via ORAL

## 2016-04-28 MED ORDER — MORPHINE SULFATE (PF) 4 MG/ML IV SOLN
4.0000 mg | INTRAVENOUS | Status: DC | PRN
  Administered 2016-04-28 – 2016-04-29 (×6): 4 mg via INTRAVENOUS
  Filled 2016-04-28 (×6): qty 1

## 2016-04-28 MED ORDER — HEPARIN SODIUM (PORCINE) 5000 UNIT/ML IJ SOLN
5000.0000 [IU] | Freq: Three times a day (TID) | INTRAMUSCULAR | Status: DC
  Administered 2016-04-28 – 2016-05-03 (×16): 5000 [IU] via SUBCUTANEOUS
  Filled 2016-04-28 (×15): qty 1

## 2016-04-28 MED ORDER — DICYCLOMINE HCL 10 MG PO CAPS
ORAL_CAPSULE | ORAL | Status: AC
  Administered 2016-04-28: 10 mg
  Filled 2016-04-28: qty 1

## 2016-04-28 MED ORDER — PROMETHAZINE HCL 25 MG/ML IJ SOLN
12.5000 mg | Freq: Four times a day (QID) | INTRAMUSCULAR | Status: DC | PRN
  Administered 2016-04-28 – 2016-04-29 (×2): 12.5 mg via INTRAVENOUS
  Filled 2016-04-28 (×2): qty 1

## 2016-04-28 MED ORDER — ONDANSETRON HCL 4 MG/2ML IJ SOLN
4.0000 mg | Freq: Four times a day (QID) | INTRAMUSCULAR | Status: DC | PRN
  Administered 2016-04-28 – 2016-04-29 (×2): 4 mg via INTRAVENOUS
  Filled 2016-04-28 (×2): qty 2

## 2016-04-28 NOTE — ED Notes (Signed)
Pt up to first nurse desk, stated "someone's going to need to do something about this pain". Pts resp even and unlabored, skin color WNL.  Told pt that this RN would speak w/ triage RN.

## 2016-04-28 NOTE — ED Triage Notes (Signed)
Pt reports mid abd pain with nausea that started today. Pt reports no diarrhea. Pt reports temp today of 99.7oral. Pt has hx of gastric sleeve approx 17 months ago.

## 2016-04-28 NOTE — Progress Notes (Signed)
Patient gagged and pulled out nasogastric tube. Nasogastric tube is to be replaced by nursing staff at this point.  Patient feels about the same so far has not had an opportunity decompressed with nasogastric tube in place yet. Exam unchanged.  We'll reexamine in the morning with films may need surgical intervention should Catherine Hunter not improve.

## 2016-04-28 NOTE — ED Provider Notes (Signed)
[facility] Emergency Department Provider Note    First MD Initiated Contact with Patient 04/28/16 1035     (approximate)  I have reviewed the triage vital signs and the nursing notes.   HISTORY  Chief Complaint Abdominal Pain    HPI Catherine Hunter is a 40 y.o. female is roughly a year and a half status post gastric sleeve procedure presents with nausea vomiting and epigastric pain that started yesterday. Patient describes the pain as 02/05/2009 in severity. States that she has had some low-grade fevers.  Denies any shortness of breath. Eyes any chest pain. No dysuria. Denies any chance of being pregnant. Denies any vaginal discharge or bleeding.   Past Medical History:  Diagnosis Date  . Anemia   . Asthma    No family history on file. Past Surgical History:  Procedure Laterality Date  . HERNIA REPAIR    . LAPAROSCOPIC GASTRIC SLEEVE RESECTION     Patient Active Problem List   Diagnosis Date Noted  . SBO (small bowel obstruction) 04/28/2016  . Leukopenia 10/06/2015      Prior to Admission medications   Medication Sig Start Date End Date Taking? Authorizing Provider  cholecalciferol (VITAMIN D) 1000 UNITS tablet Take 1,000 Units by mouth daily.   Yes Historical Provider, MD  Multiple Vitamins-Minerals (MULTIVITAMIN WITH MINERALS) tablet Take 1 tablet by mouth daily.   Yes Historical Provider, MD  magnesium citrate SOLN Take 296 mLs (1 Bottle total) by mouth once. Patient not taking: Reported on 04/28/2016 10/06/15   Joanne Gavel, MD  ondansetron (ZOFRAN) 4 MG tablet Take 1 tablet (4 mg total) by mouth daily as needed for nausea or vomiting. 12/01/14   Ahmed Prima, MD    Allergies Hydrocodone and Oxycodone    Social History Social History  Substance Use Topics  . Smoking status: Current Some Day Smoker  . Smokeless tobacco: Never Used  . Alcohol use Yes    Review of Systems Patient denies headaches, rhinorrhea, blurry vision,  numbness, shortness of breath, chest pain, edema, cough, abdominal pain, nausea, vomiting, diarrhea, dysuria, fevers, rashes or hallucinations unless otherwise stated above in HPI. ____________________________________________   PHYSICAL EXAM:  VITAL SIGNS: Vitals:   04/28/16 1055 04/28/16 1426  BP: (!) 154/97 (!) 165/94  Pulse: 83 84  Resp: 18 18  Temp:      Constitutional: Alert and oriented. Well appearing and in no acute distress. Eyes: Conjunctivae are normal. PERRL. EOMI. Head: Atraumatic. Nose: No congestion/rhinnorhea. Mouth/Throat: Mucous membranes are moist.  Oropharynx non-erythematous. Neck: No stridor. Painless ROM. No cervical spine tenderness to palpation Hematological/Lymphatic/Immunilogical: No cervical lymphadenopathy. Cardiovascular: Normal rate, regular rhythm. Grossly normal heart sounds.  Good peripheral circulation. Respiratory: Normal respiratory effort.  No retractions. Lungs CTAB. Gastrointestinal: Soft and mild epigastric ttp. No distention. No abdominal bruits. No CVA tenderness. Musculoskeletal: No lower extremity tenderness nor edema.  No joint effusions. Neurologic:  Normal speech and language. No gross focal neurologic deficits are appreciated. No gait instability. Skin:  Skin is warm, dry and intact. No rash noted. Psychiatric: Mood and affect are normal. Speech and behavior are normal.  ____________________________________________   LABS (all labs ordered are listed, but only abnormal results are displayed)  Results for orders placed or performed during the hospital encounter of 04/28/16 (from the past 24 hour(s))  Pregnancy, urine     Status: None   Collection Time: 04/28/16  1:19 AM  Result Value Ref Range   Preg Test, Ur NEGATIVE NEGATIVE  ____________________________________________ _______________________________  [id number]  I personally reviewed all radiographic images ordered to evaluate for the above acute complaints and reviewed  radiology reports and findings.  These findings were personally discussed with the patient.  Please see medical record for radiology report.  ____________________________________________   PROCEDURES  Procedure(s) performed:  Procedures    Critical Care performed: no ____________________________________________   INITIAL IMPRESSION / ASSESSMENT AND PLAN / ED COURSE  Pertinent labs & imaging results that were available during my care of the patient were reviewed by me and considered in my medical decision making (see chart for details).  DDX: puchitis, dehiscence, gastritis, cholecystitis, cholelithiasis, pancreatitis  Catherine Hunter is a 40 y.o. who presents to the ED with above complaints. Patient is afebrile hemodynamically stable. Appears well-perfused and nontoxic. Based on her recent surgery and epigastric pain will order CT imaging to evaluate for any postoperative complication. Blood work is otherwise reassuring that was drawn last night. Do not feel that repeat laboratory testing clinically indicated at this time. We'll provide IV fluids for vomiting and dehydration.  Clinical Course as of Apr 28 1528  Sat Apr 28, 2016  1419 CT shows evidence of SBO.  Have consulted Dr. Burt Knack for further recommendations.  Patient will be admitted to gen surgery for further evaluation and management.  Have discussed with the patient and available family all diagnostics and treatments performed thus far and all questions were answered to the best of my ability. The patient demonstrates understanding and agreement with plan.   [PR]    Clinical Course User Index [PR] Merlyn Lot, MD     ____________________________________________   FINAL CLINICAL IMPRESSION(S) / ED DIAGNOSES  Final diagnoses:  Generalized abdominal pain  Small bowel obstruction      NEW MEDICATIONS STARTED DURING THIS VISIT:  New Prescriptions   No medications on file     Note:  This document was  prepared using Dragon voice recognition software and may include unintentional dictation errors.    Merlyn Lot, MD 04/28/16 [phone]

## 2016-04-28 NOTE — H&P (Signed)
Catherine Hunter is an 40 y.o. female.    Chief Complaint: Nausea and vomiting  HPI: This a patient with nausea and vomiting started yesterday afternoon she is vomited somebody time she lost count at over 15. She is not passing any gas today but did yesterday and had a normal bowel movement yesterday. She had an episode like this before. She's had some periumbilical pain which is improved.   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  No significant family history Past Medical History:  Diagnosis Date  . Anemia   . Asthma     Past Surgical History:  Procedure Laterality Date  . HERNIA REPAIR    . LAPAROSCOPIC GASTRIC SLEEVE RESECTION      No family history on file. Social History:  reports that she has been smoking.  She has never used smokeless tobacco. She reports that she drinks alcohol. She reports that she does not use drugs.  Allergies:  Allergies  Allergen Reactions  . Hydrocodone Hives and Itching  . Oxycodone Hives and Itching     (Not in a hospital admission)   Review of Systems  Constitutional: Negative for chills and fever.  HENT: Negative.   Eyes: Negative.   Respiratory: Negative.   Cardiovascular: Negative.   Gastrointestinal: Positive for abdominal pain, nausea and vomiting. Negative for blood in stool, constipation, diarrhea and heartburn.  Genitourinary: Negative.   Musculoskeletal: Negative.   Skin: Negative.   Neurological: Negative.   Endo/Heme/Allergies: Negative.   Psychiatric/Behavioral: Negative.      Physical Exam:  BP (!) 165/94 (BP Location: Right Arm)   Pulse 84   Temp 98.3 F (36.8 C) (Oral)   Resp 18   Ht 5\' 1"  (1.549 m)   Wt 141 lb (64 kg)   SpO2 100%   BMI 26.64 kg/m   Physical Exam  Constitutional: She is oriented to person, place, and time and well-developed, well-nourished, and in no distress. No distress.  HENT:  Head: Normocephalic and  atraumatic.  Eyes: Pupils are equal, round, and reactive to light. Right eye exhibits no discharge. Left eye exhibits no discharge. No scleral icterus.  Neck: Normal range of motion.  Cardiovascular: Normal rate, regular rhythm and normal heart sounds.   Pulmonary/Chest: Effort normal and breath sounds normal. No respiratory distress. She has no wheezes. She has no rales.  Abdominal: Soft. She exhibits distension. There is tenderness. There is no rebound and no guarding.  Laparoscopy scars noted. Patient's abdomen is distended tympanitic minimally tender in the periumbilical area without guarding or rebound. No percussion tenderness  Musculoskeletal: Normal range of motion. She exhibits no edema or tenderness.  Lymphadenopathy:    She has no cervical adenopathy.  Neurological: She is alert and oriented to person, place, and time.  Skin: Skin is warm and dry. No rash noted. She is not diaphoretic. No erythema.  Psychiatric: Mood and affect normal.  Vitals reviewed.       Results for orders placed or performed during the hospital encounter of 04/28/16 (from the past 48 hour(s))  Pregnancy, urine     Status: None   Collection Time: 04/28/16  1:19 AM  Result Value Ref Range   Preg Test, Ur NEGATIVE NEGATIVE   Ct Abdomen Pelvis W Contrast  Result Date: 04/28/2016 CLINICAL DATA:  Abdominal pain for 2 days with nausea and vomiting. History of 2 ectopic pregnancies. History of gastric sleeve surgery. EXAM: CT ABDOMEN  AND PELVIS WITH CONTRAST TECHNIQUE: Multidetector CT imaging of the abdomen and pelvis was performed using the standard protocol following bolus administration of intravenous contrast. CONTRAST:  124mL ISOVUE-300 IOPAMIDOL (ISOVUE-300) INJECTION 61% COMPARISON:  Abdominal radiographs, 10/06/2015 FINDINGS: Lower chest: No acute abnormality. Hepatobiliary: No focal liver abnormality is seen. No gallstones, gallbladder wall thickening, or biliary dilatation. Pancreas: Unremarkable. No  pancreatic ductal dilatation or surrounding inflammatory changes. Spleen: Normal in size without focal abnormality. Adrenals/Urinary Tract: Adrenal glands are unremarkable. Kidneys are normal, without renal calculi, focal lesion, or hydronephrosis. Bladder is unremarkable. Stomach/Bowel: There is distended small bowel throughout the proximal and mid small bowel, greatest diameter approximately 4 cm. A transition point is seen between decompressed distal small bowel and the dilated small bowel in the right mid abdomen consistent with an adhesion. The colon is mostly decompressed. There is no bowel wall thickening or adjacent inflammation. Stomach is mostly decompressed with changes from the previous gastric sleeve procedure. Normal appendix visualized. Vascular/Lymphatic: No significant vascular findings are present. No enlarged abdominal or pelvic lymph nodes. Reproductive: Multiple uterine fibroids. There is a peripherally calcified hypoattenuating fibroid along the right posterior aspect of the uterus measuring 3.3 cm. Multiple other subserosal fibroids folds the uterine contour. No adnexal masses. Other: Small amount of ascites. No free air. No abdominal wall hernia. Musculoskeletal: No acute or significant osseous findings. IMPRESSION: 1. High-grade partial small bowel obstruction with the transition point noted in the right mid abdomen likely due to adhesions. Small amount of associated ascites. 2. No other acute abnormalities. 3. Multiple uterine fibroids. Electronically Signed   By: Lajean Manes M.D.   On: 04/28/2016 14:03     Assessment/Plan  Labs and studies are personally reviewed. This a patient with abdominal pain which is improving with extensive nausea vomiting and signs of a partial small bowel obstruction. She's had a gastric sleeve procedure in the past. I recommended placement of a nasogastric tube and rehydration and reexamination. Should this patient not promptly improve she will require  surgical intervention. This is discussed with her no family members were present. This was discussed with the emergency room physician  Florene Glen, MD, FACS

## 2016-04-28 NOTE — ED Triage Notes (Signed)
Pt states that she started with abd pain with n/v/d around 1700 yesterday, pt states that she came to the ER yesterday but was too busy and cont to have discomfort

## 2016-04-28 NOTE — ED Notes (Signed)
This tech to triage 1 to obtain labs from pt per Rn [facility]) order, pt states "I just had blood work done at 0100 this morning. I explained to pt that some lab results may have changed since then and that we would need to obtain more pt states "ya'll done took blood from me one time ya'll ain't getting anymore until I see the doctor" pt placed back in lobby per Rn Caryl Pina)

## 2016-04-28 NOTE — ED Notes (Signed)
Spoke with Dr. Reita Cliche, no need to repeat lab work previously done, pt needs urine pregnancy however, if not already completed.  Lab called to add on urine pregnancy to previous urine specimen.

## 2016-04-29 ENCOUNTER — Inpatient Hospital Stay: Payer: BLUE CROSS/BLUE SHIELD

## 2016-04-29 ENCOUNTER — Inpatient Hospital Stay: Payer: BLUE CROSS/BLUE SHIELD | Admitting: Anesthesiology

## 2016-04-29 ENCOUNTER — Encounter
Admission: EM | Disposition: A | Discharge status: Home / Self Care | Payer: Self-pay | Source: Home / Self Care | Attending: Surgery

## 2016-04-29 HISTORY — PX: LAPAROTOMY: SHX154

## 2016-04-29 LAB — COMPREHENSIVE METABOLIC PANEL
ALBUMIN: 3.1 g/dL — AB (ref 3.5–5.0)
ALT: 10 U/L — ABNORMAL LOW (ref 14–54)
ANION GAP: 5 (ref 5–15)
AST: 16 U/L (ref 15–41)
Alkaline Phosphatase: 43 U/L (ref 38–126)
BILIRUBIN TOTAL: 0.3 mg/dL (ref 0.3–1.2)
BUN: <5 mg/dL — ABNORMAL LOW (ref 6–20)
CHLORIDE: 108 mmol/L (ref 101–111)
CO2: 23 mmol/L (ref 22–32)
Calcium: 8.5 mg/dL — ABNORMAL LOW (ref 8.9–10.3)
Creatinine, Ser: 0.41 mg/dL — ABNORMAL LOW (ref 0.44–1.00)
GFR calc Af Amer: >60 mL/min (ref >60)
Glucose, Bld: 129 mg/dL — ABNORMAL HIGH (ref 65–99)
POTASSIUM: 3.6 mmol/L (ref 3.5–5.1)
Sodium: 136 mmol/L (ref 135–145)
TOTAL PROTEIN: 6.3 g/dL — AB (ref 6.5–8.1)

## 2016-04-29 SURGERY — LAPAROTOMY, EXPLORATORY
Anesthesia: General

## 2016-04-29 MED ORDER — ONDANSETRON HCL 4 MG/2ML IJ SOLN
INTRAMUSCULAR | Status: DC | PRN
  Administered 2016-04-29: 4 mg via INTRAVENOUS

## 2016-04-29 MED ORDER — HYDRALAZINE HCL 20 MG/ML IJ SOLN
INTRAMUSCULAR | Status: DC | PRN
  Administered 2016-04-29 (×2): 5 mg via INTRAVENOUS

## 2016-04-29 MED ORDER — ROCURONIUM BROMIDE 50 MG/5ML IV SOSY
PREFILLED_SYRINGE | INTRAVENOUS | Status: AC
  Filled 2016-04-29: qty 5

## 2016-04-29 MED ORDER — DIPHENHYDRAMINE HCL 12.5 MG/5ML PO ELIX: 12.5000 mg | ORAL_SOLUTION | Freq: Four times a day (QID) | ORAL | Status: DC | PRN

## 2016-04-29 MED ORDER — NEOSTIGMINE METHYLSULFATE 10 MG/10ML IV SOLN
INTRAVENOUS | Status: DC | PRN
  Administered 2016-04-29: 3 mg via INTRAVENOUS
  Administered 2016-04-29: 1 mg via INTRAVENOUS

## 2016-04-29 MED ORDER — FENTANYL CITRATE (PF) 250 MCG/5ML IJ SOLN
INTRAMUSCULAR | Status: AC
  Filled 2016-04-29: qty 5

## 2016-04-29 MED ORDER — GLYCOPYRROLATE 0.2 MG/ML IJ SOLN
INTRAMUSCULAR | Status: AC
  Filled 2016-04-29: qty 3

## 2016-04-29 MED ORDER — BUPIVACAINE HCL (PF) 0.25 % IJ SOLN
INTRAMUSCULAR | Status: DC | PRN
  Administered 2016-04-29: 30 mL

## 2016-04-29 MED ORDER — ONDANSETRON HCL 4 MG/2ML IJ SOLN: 4.0000 mg | Freq: Four times a day (QID) | INTRAMUSCULAR | Status: DC | PRN

## 2016-04-29 MED ORDER — SUGAMMADEX SODIUM 200 MG/2ML IV SOLN
INTRAVENOUS | Status: DC | PRN
  Administered 2016-04-29: 50 mg via INTRAVENOUS

## 2016-04-29 MED ORDER — MIDAZOLAM HCL 2 MG/2ML IJ SOLN
INTRAMUSCULAR | Status: DC | PRN
  Administered 2016-04-29: 2 mg via INTRAVENOUS

## 2016-04-29 MED ORDER — NEOSTIGMINE METHYLSULFATE 5 MG/5ML IV SOSY
PREFILLED_SYRINGE | INTRAVENOUS | Status: AC
  Filled 2016-04-29: qty 5

## 2016-04-29 MED ORDER — ROCURONIUM BROMIDE 100 MG/10ML IV SOLN
INTRAVENOUS | Status: DC | PRN
  Administered 2016-04-29: 20 mg via INTRAVENOUS
  Administered 2016-04-29: 30 mg via INTRAVENOUS

## 2016-04-29 MED ORDER — DIPHENHYDRAMINE HCL 50 MG/ML IJ SOLN: 12.5000 mg | Freq: Four times a day (QID) | INTRAMUSCULAR | Status: DC | PRN

## 2016-04-29 MED ORDER — CEFAZOLIN SODIUM-DEXTROSE 2-4 GM/100ML-% IV SOLN
2.0000 g | Freq: Once | INTRAVENOUS | Status: AC
  Administered 2016-04-29: 2 g via INTRAVENOUS
  Filled 2016-04-29: qty 100

## 2016-04-29 MED ORDER — SODIUM CHLORIDE 0.9% FLUSH: 9.0000 mL | INTRAVENOUS | Status: DC | PRN

## 2016-04-29 MED ORDER — BUPIVACAINE HCL (PF) 0.25 % IJ SOLN
INTRAMUSCULAR | Status: AC
  Filled 2016-04-29: qty 30

## 2016-04-29 MED ORDER — HYDROMORPHONE 1 MG/ML IV SOLN
INTRAVENOUS | Status: DC
  Administered 2016-04-29: 14:00:00 via INTRAVENOUS
  Administered 2016-04-29: 1.1 mg via INTRAVENOUS
  Administered 2016-04-30: 0.3 mg via INTRAVENOUS
  Administered 2016-04-30: 1.5 mg via INTRAVENOUS
  Administered 2016-05-01: 0.9 mg via INTRAVENOUS
  Administered 2016-05-01: 0.6 mg via INTRAVENOUS
  Administered 2016-05-01: 0.3 mg via INTRAVENOUS
  Administered 2016-05-01: 10.5 mg via INTRAVENOUS
  Administered 2016-05-01: 0 mg via INTRAVENOUS
  Administered 2016-05-01: 1.5 mg via INTRAVENOUS
  Administered 2016-05-02 (×2): 0.9 mg via INTRAVENOUS
  Filled 2016-04-29 (×2): qty 25

## 2016-04-29 MED ORDER — ONDANSETRON HCL 4 MG/2ML IJ SOLN: 4.0000 mg | Freq: Once | INTRAMUSCULAR | Status: DC | PRN

## 2016-04-29 MED ORDER — DEXAMETHASONE SODIUM PHOSPHATE 10 MG/ML IJ SOLN
INTRAMUSCULAR | Status: DC | PRN
  Administered 2016-04-29: 10 mg via INTRAVENOUS

## 2016-04-29 MED ORDER — FENTANYL CITRATE (PF) 100 MCG/2ML IJ SOLN
25.0000 ug | INTRAMUSCULAR | Status: DC | PRN
  Administered 2016-04-29 (×3): 25 ug via INTRAVENOUS

## 2016-04-29 MED ORDER — DEXAMETHASONE SODIUM PHOSPHATE 10 MG/ML IJ SOLN
INTRAMUSCULAR | Status: AC
  Filled 2016-04-29: qty 1

## 2016-04-29 MED ORDER — METOPROLOL TARTARATE 1 MG/ML SYRINGE (5ML)
Status: AC
  Filled 2016-04-29: qty 5

## 2016-04-29 MED ORDER — PROPOFOL 10 MG/ML IV BOLUS
INTRAVENOUS | Status: DC | PRN
  Administered 2016-04-29: 100 mg via INTRAVENOUS

## 2016-04-29 MED ORDER — FENTANYL CITRATE (PF) 100 MCG/2ML IJ SOLN
INTRAMUSCULAR | Status: AC
  Filled 2016-04-29: qty 2

## 2016-04-29 MED ORDER — METOPROLOL TARTRATE 5 MG/5ML IV SOLN
INTRAVENOUS | Status: DC | PRN
  Administered 2016-04-29: 2 mg via INTRAVENOUS

## 2016-04-29 MED ORDER — LACTATED RINGERS IV SOLN
INTRAVENOUS | Status: DC | PRN
  Administered 2016-04-29: 11:00:00 via INTRAVENOUS

## 2016-04-29 MED ORDER — PROPOFOL 10 MG/ML IV BOLUS
INTRAVENOUS | Status: AC
  Filled 2016-04-29: qty 20

## 2016-04-29 MED ORDER — ONDANSETRON HCL 4 MG/2ML IJ SOLN
INTRAMUSCULAR | Status: AC
  Filled 2016-04-29: qty 2

## 2016-04-29 MED ORDER — FENTANYL CITRATE (PF) 100 MCG/2ML IJ SOLN
INTRAMUSCULAR | Status: DC | PRN
  Administered 2016-04-29: 150 ug via INTRAVENOUS

## 2016-04-29 MED ORDER — HYDRALAZINE HCL 20 MG/ML IJ SOLN
INTRAMUSCULAR | Status: AC
  Filled 2016-04-29: qty 1

## 2016-04-29 MED ORDER — NALOXONE HCL 0.4 MG/ML IJ SOLN: 0.4000 mg | INTRAMUSCULAR | Status: DC | PRN

## 2016-04-29 MED ORDER — GLYCOPYRROLATE 0.2 MG/ML IJ SOLN
INTRAMUSCULAR | Status: DC | PRN
  Administered 2016-04-29: 0.6 mg via INTRAVENOUS

## 2016-04-29 MED ORDER — MIDAZOLAM HCL 2 MG/2ML IJ SOLN
INTRAMUSCULAR | Status: AC
  Filled 2016-04-29: qty 2

## 2016-04-29 SURGICAL SUPPLY — 30 items
BRR ADH 6X5 SEPRAFILM 1 SHT (MISCELLANEOUS) ×1
CANISTER SUCT 1200ML W/VALVE (MISCELLANEOUS) ×3 IMPLANT
CATH TRAY 16F METER LATEX (MISCELLANEOUS) ×3 IMPLANT
CHLORAPREP W/TINT 26ML (MISCELLANEOUS) ×3 IMPLANT
DRAPE LAPAROTOMY 100X77 ABD (DRAPES) ×3 IMPLANT
DRSG OPSITE POSTOP 4X10 (GAUZE/BANDAGES/DRESSINGS) ×4 IMPLANT
DRSG TELFA 3X8 NADH (GAUZE/BANDAGES/DRESSINGS) ×3 IMPLANT
ELECT REM PT RETURN 9FT ADLT (ELECTROSURGICAL) ×3
ELECTRODE REM PT RTRN 9FT ADLT (ELECTROSURGICAL) ×1 IMPLANT
GAUZE SPONGE 4X4 12PLY STRL (GAUZE/BANDAGES/DRESSINGS) ×1 IMPLANT
GLOVE BIO SURGEON STRL SZ8 (GLOVE) ×9 IMPLANT
GOWN STRL REUS W/ TWL LRG LVL3 (GOWN DISPOSABLE) ×2 IMPLANT
GOWN STRL REUS W/TWL LRG LVL3 (GOWN DISPOSABLE) ×6
KIT RM TURNOVER STRD PROC AR (KITS) ×3 IMPLANT
LABEL OR SOLS (LABEL) ×3 IMPLANT
NDL SAFETY 22GX1.5 (NEEDLE) ×3 IMPLANT
NS IRRIG 1000ML POUR BTL (IV SOLUTION) ×3 IMPLANT
PACK BASIN MAJOR ARMC (MISCELLANEOUS) ×3 IMPLANT
PACK COLON CLEAN CLOSURE (MISCELLANEOUS) ×3 IMPLANT
PAD DRESSING TELFA 3X8 NADH (GAUZE/BANDAGES/DRESSINGS) ×1 IMPLANT
SEPRAFILM MEMBRANE 5X6 (MISCELLANEOUS) ×3 IMPLANT
STAPLER SKIN PROX 35W (STAPLE) ×3 IMPLANT
SUT PDS AB 1 TP1 54 (SUTURE) ×6 IMPLANT
SUT PROLENE 0 CT 1 30 (SUTURE) ×9 IMPLANT
SUT SILK 3-0 (SUTURE) ×3 IMPLANT
SUT VIC AB 3-0 SH 27 (SUTURE) ×3
SUT VIC AB 3-0 SH 27X BRD (SUTURE) ×1 IMPLANT
SUT VICRYL 2 0 18  UND BR (SUTURE) ×2
SUT VICRYL 2 0 18 UND BR (SUTURE) ×1 IMPLANT
SYRINGE 10CC LL (SYRINGE) ×3 IMPLANT

## 2016-04-29 NOTE — Anesthesia Preprocedure Evaluation (Addendum)
Anesthesia Evaluation  Patient identified by MRN, date of birth, ID band Patient awake    Reviewed: Allergy & Precautions, NPO status , Patient's Chart, lab work & pertinent test results  History of Anesthesia Complications Negative for: history of anesthetic complications  Airway Mallampati: II       Dental   Pulmonary asthma , Current Smoker,           Cardiovascular negative cardio ROS       Neuro/Psych negative neurological ROS     GI/Hepatic negative GI ROS, Neg liver ROS,   Endo/Other  negative endocrine ROS  Renal/GU negative Renal ROS     Musculoskeletal   Abdominal   Peds  Hematology  (+) anemia ,   Anesthesia Other Findings   Reproductive/Obstetrics                             Anesthesia Physical Anesthesia Plan  ASA: II and emergent  Anesthesia Plan: General   Post-op Pain Management:    Induction: Intravenous and Rapid sequence  Airway Management Planned: Oral ETT  Additional Equipment:   Intra-op Plan:   Post-operative Plan:   Informed Consent: I have reviewed the patients History and Physical, chart, labs and discussed the procedure including the risks, benefits and alternatives for the proposed anesthesia with the patient or authorized representative who has indicated his/her understanding and acceptance.     Plan Discussed with:   Anesthesia Plan Comments:        Anesthesia Quick Evaluation

## 2016-04-29 NOTE — Progress Notes (Signed)
Abdominal films reviewed.  Visited patient. She is no better continues to have pain and is not passing any gas.  Exam unchanged.  Signs of continued bowel obstruction with no improvement with nasogastric suction. Patient continues to have pain and warrants exploration. I discussed rationale for offering surgery and the risk of bleeding infection compromised bowel bowel resection she asked about ostomy which is a small but not 0 risk. Questions were answered for she and her family they understood and agreed to proceed with plans for exploratory laparotomy for bowel obstruction

## 2016-04-29 NOTE — Anesthesia Procedure Notes (Signed)
Procedure Name: Intubation Date/Time: 04/29/2016 11:15 AM Performed by: Jonna Clark Pre-anesthesia Checklist: Patient identified, Patient being monitored, Timeout performed, Emergency Drugs available and Suction available Patient Re-evaluated:Patient Re-evaluated prior to inductionOxygen Delivery Method: Circle system utilized Preoxygenation: Pre-oxygenation with 100% oxygen Intubation Type: IV induction Ventilation: Mask ventilation without difficulty Laryngoscope Size: Miller and 2 Grade View: Grade I Tube type: Oral Tube size: 7.0 mm Number of attempts: 1 Placement Confirmation: ETT inserted through vocal cords under direct vision,  positive ETCO2 and breath sounds checked- equal and bilateral Secured at: 21 cm Tube secured with: Tape Dental Injury: Teeth and Oropharynx as per pre-operative assessment

## 2016-04-29 NOTE — Transfer of Care (Signed)
Immediate Anesthesia Transfer of Care Note  Patient: Catherine Hunter  Procedure(s) Performed: Procedure(s): EXPLORATORY LAPAROTOMY (N/A)  Patient Location: PACU  Anesthesia Type:General  Level of Consciousness: patient cooperative and lethargic  Airway & Oxygen Therapy: Patient Spontanous Breathing and Patient connected to face mask oxygen  Post-op Assessment: Report given to RN and Post -op Vital signs reviewed and stable  Post vital signs: Reviewed and stable  Last Vitals:  Vitals:   04/29/16 1230 04/29/16 1232  BP: 118/80 118/80  Pulse: 74 80  Resp: 19 18  Temp: 36.7 C     Last Pain:  Vitals:   04/29/16 0800  TempSrc:   PainSc: 0-No pain      Patients Stated Pain Goal: 0 ([id number])  Complications: No apparent anesthesia complications

## 2016-04-29 NOTE — Progress Notes (Signed)
Patient off floor to OR

## 2016-04-29 NOTE — Progress Notes (Signed)
CC: SBO Subjective: Patient is not passing any gas and has continued abdominal pain she is better after receiving analgesics but continues to have pain. NG tube is in place with no further nausea or vomiting and no fevers or chills Objective: Vital signs in last 24 hours: Temp:  [98.3 F (36.8 C)-99.6 F (37.6 C)] 99.6 F (37.6 C) (12/31 0435) Pulse Rate:  [74-88] 86 (12/31 0435) Resp:  [16-18] 16 (12/31 0435) BP: (150-170)/(86-99) 150/89 (12/31 0435) SpO2:  [95 %-100 %] 100 % (12/31 0435) Weight:  [141 lb (64 kg)] 141 lb (64 kg) (12/30 0837)    Intake/Output from previous day: 12/30 0701 - 12/31 0700 In: 2056 [I.V.:977; NG/GT:80; IV Piggyback:999] Out: 1050 [Urine:600; Emesis/NG output:450] Intake/Output this shift: No intake/output data recorded.  Physical exam:  Abdomen remains distended tympanitic minimally tender without guarding or rebound. Vital signs reviewed and stable are nontender. Alert and oriented  Lab Results: CBC   Recent Labs  04/28/16 0119  WBC 6.2  HGB 8.6*  HCT 28.5*  PLT 363   BMET  Recent Labs  04/28/16 0119 04/29/16 0616  NA 134* 136  K 3.6 3.6  CL 104 108  CO2 21* 23  GLUCOSE 139* 129*  BUN 9 <5*  CREATININE 0.60 0.41*  CALCIUM 9.4 8.5*   PT/INR No results for input(s): LABPROT, INR in the last 72 hours. ABG No results for input(s): PHART, HCO3 in the last 72 hours.  Invalid input(s): PCO2, PO2  Studies/Results: Ct Abdomen Pelvis W Contrast  Result Date: 04/28/2016 CLINICAL DATA:  Abdominal pain for 2 days with nausea and vomiting. History of 2 ectopic pregnancies. History of gastric sleeve surgery. EXAM: CT ABDOMEN AND PELVIS WITH CONTRAST TECHNIQUE: Multidetector CT imaging of the abdomen and pelvis was performed using the standard protocol following bolus administration of intravenous contrast. CONTRAST:  133mL ISOVUE-300 IOPAMIDOL (ISOVUE-300) INJECTION 61% COMPARISON:  Abdominal radiographs, 10/06/2015 FINDINGS: Lower chest:  No acute abnormality. Hepatobiliary: No focal liver abnormality is seen. No gallstones, gallbladder wall thickening, or biliary dilatation. Pancreas: Unremarkable. No pancreatic ductal dilatation or surrounding inflammatory changes. Spleen: Normal in size without focal abnormality. Adrenals/Urinary Tract: Adrenal glands are unremarkable. Kidneys are normal, without renal calculi, focal lesion, or hydronephrosis. Bladder is unremarkable. Stomach/Bowel: There is distended small bowel throughout the proximal and mid small bowel, greatest diameter approximately 4 cm. A transition point is seen between decompressed distal small bowel and the dilated small bowel in the right mid abdomen consistent with an adhesion. The colon is mostly decompressed. There is no bowel wall thickening or adjacent inflammation. Stomach is mostly decompressed with changes from the previous gastric sleeve procedure. Normal appendix visualized. Vascular/Lymphatic: No significant vascular findings are present. No enlarged abdominal or pelvic lymph nodes. Reproductive: Multiple uterine fibroids. There is a peripherally calcified hypoattenuating fibroid along the right posterior aspect of the uterus measuring 3.3 cm. Multiple other subserosal fibroids folds the uterine contour. No adnexal masses. Other: Small amount of ascites. No free air. No abdominal wall hernia. Musculoskeletal: No acute or significant osseous findings. IMPRESSION: 1. High-grade partial small bowel obstruction with the transition point noted in the right mid abdomen likely due to adhesions. Small amount of associated ascites. 2. No other acute abnormalities. 3. Multiple uterine fibroids. Electronically Signed   By: Lajean Manes M.D.   On: 04/28/2016 14:03   Dg Abd 2 Views  Result Date: 04/28/2016 CLINICAL DATA:  Small bowel obstruction. EXAM: ABDOMEN - 2 VIEW COMPARISON:  CT of the abdomen and pelvis  earlier today. FINDINGS: Nasogastric tube extends below the diaphragm  and is coiled in the stomach. Dilated small bowel loops present with predominant dilatation of proximal to mid small bowel loops. These contain multiple air-fluid levels. No free air identified on the upright film. The colon appears decompressed. Calcified uterine fibroid present with mass effect on the bladder from an enlarged uterus. IMPRESSION: Nasogastric tube is coiled in the stomach. Small bowel dilatation noted without evidence of free air. Electronically Signed   By: Aletta Edouard M.D.   On: 04/28/2016 16:05   Dg Abd Portable 1v  Result Date: 04/29/2016 CLINICAL DATA:  Small bowel obstruction. EXAM: PORTABLE ABDOMEN - 1 VIEW COMPARISON:  Radiograph April 28, 2016. FINDINGS: Distal tip of nasogastric tube is seen in proximal stomach. Stable small bowel dilatation is noted concerning for distal small bowel obstruction or ileus. No colonic dilatation is noted. IMPRESSION: Stable position of distal tip of nasogastric tube in proximal stomach. Stable small bowel dilatation concerning for distal small bowel obstruction or ileus. Electronically Signed   By: Marijo Conception, M.D.   On: 04/29/2016 07:27   Dg Abd Portable 1v  Result Date: 04/28/2016 CLINICAL DATA:  Nasogastric tube placement. EXAM: PORTABLE ABDOMEN - 1 VIEW COMPARISON:  Radiographs of same day. FINDINGS: Distal tip of nasogastric tube is seen in proximal stomach. Dilated small bowel loops are again noted. IMPRESSION: Distal tip of nasogastric tube seen in proximal stomach. Dilated small bowel loops are noted concerning for distal small bowel obstruction or ileus. Follow-up radiographs are recommended. Electronically Signed   By: Marijo Conception, M.D.   On: 04/28/2016 18:08    Anti-infectives: Anti-infectives    None      Assessment/Plan:  Abdominal films are pending. With the patient's failure to improve and continued abdominal pain we will wait for the results of the KUB and personally review those films however she will  likely require an operation today for continued pain and failure to pass gas. In detail with her later.  Florene Glen, MD, FACS  04/29/2016

## 2016-04-29 NOTE — Anesthesia Preprocedure Evaluation (Addendum)
Anesthesia Evaluation  Patient identified by MRN, date of birth, ID band Patient awake    Reviewed: Allergy & Precautions, NPO status , Patient's Chart, lab work & pertinent test results  Airway Mallampati: II       Dental   Pulmonary asthma , Current Smoker,           Cardiovascular negative cardio ROS       Neuro/Psych negative neurological ROS     GI/Hepatic negative GI ROS, Neg liver ROS,   Endo/Other  negative endocrine ROS  Renal/GU negative Renal ROS     Musculoskeletal   Abdominal   Peds  Hematology  (+) anemia ,   Anesthesia Other Findings   Reproductive/Obstetrics                            Anesthesia Physical Anesthesia Plan  ASA: II  Anesthesia Plan: General   Post-op Pain Management:    Induction: Intravenous and Rapid sequence  Airway Management Planned: Oral ETT  Additional Equipment:   Intra-op Plan:   Post-operative Plan:   Informed Consent: I have reviewed the patients History and Physical, chart, labs and discussed the procedure including the risks, benefits and alternatives for the proposed anesthesia with the patient or authorized representative who has indicated his/her understanding and acceptance.     Plan Discussed with:   Anesthesia Plan Comments:         Anesthesia Quick Evaluation

## 2016-04-29 NOTE — Op Note (Signed)
   Pre-operative Diagnosis: Small bowel obstruction  Post-operative Diagnosis: Small bowel obstruction   Surgeon: Phoebe Perch   Assistants: Surgical tech  Anesthesia: Gen. with endotracheal tube  Procedure: Exploratory laparotomy lysis of adhesions incidental appendectomy  Procedure Details  The patient was seen again in the Holding Room. The benefits, complications, treatment options, and expected outcomes were discussed with the patient. The risks of bleeding, infection, recurrence of symptoms, failure to resolve symptoms,  bowel injury, any of which could require further surgery were reviewed with the patient.   The patient was taken to Operating Room, identified as Catherine Hunter and the procedure verified.  A Time Out was held and the above information confirmed.  Prior to the induction of general anesthesia, antibiotic prophylaxis was administered. VTE prophylaxis was in place. General endotracheal anesthesia was then administered and tolerated well. After the induction, the abdomen was prepped with Chloraprep and draped in the sterile fashion. The patient was positioned in the supine position.  A Foley catheter was placed.  Once patient was prepped draped sterile fashion a midline incision was utilized to open and explore the abdominal cavity. Very distended bowel was identified and a large amount of clear ascites was aspirated for a total of over 300 cc. The bowel was run proximal to distal and there were areas of collapse and dilatation. An area of constriction around a single ropey band of omentum to the right lower quadrant was identified and cut with electrocautery. This release the bowel obstruction. The area of constriction of the small bowel was not per my seeing of the bowel in any way.  The bowel was run multiple times from the ligament of Treitz to the ileocecal valve and back. No other pathology was identified.  Of note however were extensive and large uterine  fibroids.  The appendix was identified and due to the multiple surgeries in the past an appendectomy was performed by dividing the base of the appendix between clamps and tying with 30-or Vicryl. The mesoappendix was handled with 3-0 Vicryl as well. One last time the bowel was run again from the ileocecal valve to ligament of Treitz and then placed back into the abdominal cavity.  Once suturing that the bowel obstruction had been released the wound was closed after placing a piece of Seprafilm with 0 PDS non-looped. During the closure while tying the final knot the suture broke. The suture ends were retrieved and a new suture was utilized to re-suture the abdominal cavity. Catherine Hunter was infiltrated into the skin and subcutaneous tissues tissues for a total of 30 cc. And skin staples were placed and a sterile dressing was placed.  Patient out of the procedure well the workup occasions Catherine Hunter was taken to recovery room in stable condition to be admitted for continued care sponge lap needle count was correct S May blood loss nil is loss over 300 cc  Findings: Single ropey adhesion with noncompromised bowel. Large amount of ascites  Estimated Blood Loss: Nil         Drains: None         Specimens: Appendix       Complications: None                  Condition: Stable   Makahla Kiser E. Burt Knack, MD, FACS

## 2016-04-29 NOTE — Anesthesia Postprocedure Evaluation (Signed)
Anesthesia Post Note  Patient: Catherine Hunter  Procedure(s) Performed: Procedure(s) (LRB): EXPLORATORY LAPAROTOMY and appendectomy (N/A)  Patient location during evaluation: PACU Anesthesia Type: General Level of consciousness: awake and alert Pain management: pain level controlled Vital Signs Assessment: post-procedure vital signs reviewed and stable Respiratory status: spontaneous breathing and respiratory function stable Cardiovascular status: stable Anesthetic complications: no     Last Vitals:  Vitals:   04/29/16 1322 04/29/16 1352  BP: 111/68   Pulse: 87   Resp: 18 18  Temp: 36.9 C     Last Pain:  Vitals:   04/29/16 1352  TempSrc:   PainSc: 10-Worst pain ever                 Takyia Sindt K

## 2016-04-30 LAB — BASIC METABOLIC PANEL
ANION GAP: 6 (ref 5–15)
BUN: <5 mg/dL — ABNORMAL LOW (ref 6–20)
CALCIUM: 8.2 mg/dL — AB (ref 8.9–10.3)
CO2: 27 mmol/L (ref 22–32)
CREATININE: 0.55 mg/dL (ref 0.44–1.00)
Chloride: 102 mmol/L (ref 101–111)
GFR calc non Af Amer: >60 mL/min (ref >60)
Glucose, Bld: 89 mg/dL (ref 65–99)
Potassium: 3.2 mmol/L — ABNORMAL LOW (ref 3.5–5.1)
SODIUM: 135 mmol/L (ref 135–145)

## 2016-04-30 LAB — CBC WITH DIFFERENTIAL/PLATELET
BASOS ABS: 0 10*3/uL (ref 0–0.1)
BASOS PCT: 0 %
EOS ABS: 0 10*3/uL (ref 0–0.7)
Eosinophils Relative: 0 %
HCT: 25.1 % — ABNORMAL LOW (ref 35.0–47.0)
HEMOGLOBIN: 7.7 g/dL — AB (ref 12.0–16.0)
Lymphocytes Relative: 17 %
Lymphs Abs: 0.8 10*3/uL — ABNORMAL LOW (ref 1.0–3.6)
MCH: 21.1 pg — ABNORMAL LOW (ref 26.0–34.0)
MCHC: 30.6 g/dL — AB (ref 32.0–36.0)
MCV: 69.1 fL — ABNORMAL LOW (ref 80.0–100.0)
MONO ABS: 0.5 10*3/uL (ref 0.2–0.9)
MONOS PCT: 12 %
NEUTROS ABS: 3.1 10*3/uL (ref 1.4–6.5)
NEUTROS PCT: 71 %
Platelets: 319 10*3/uL (ref 150–440)
RBC: 3.64 MIL/uL — ABNORMAL LOW (ref 3.80–5.20)
RDW: 21.1 % — AB (ref 11.5–14.5)
WBC: 4.4 10*3/uL (ref 3.6–11.0)

## 2016-04-30 LAB — MAGNESIUM: MAGNESIUM: 1.7 mg/dL (ref 1.7–2.4)

## 2016-04-30 MED ORDER — MAGNESIUM SULFATE 2 GM/50ML IV SOLN
2.0000 g | Freq: Once | INTRAVENOUS | Status: AC
  Administered 2016-04-30: 2 g via INTRAVENOUS
  Filled 2016-04-30: qty 50

## 2016-04-30 MED ORDER — SODIUM CHLORIDE 0.9 % IV SOLN
30.0000 meq | Freq: Once | INTRAVENOUS | Status: AC
  Administered 2016-04-30: 30 meq via INTRAVENOUS
  Filled 2016-04-30: qty 15

## 2016-04-30 NOTE — Progress Notes (Signed)
04/30/2016  Subjective: Patient status post exploratory laparotomy for small bowel obstruction. No acute events overnight. Foley catheter has been removed this morning. Patient reports that she's having abdominal pain that is controlled with the PCA. Denies any nausea and so far no flatus.  Vital signs: Temp:  [98.1 F (36.7 C)-100 F (37.8 C)] 99.3 F (37.4 C) (01/01 0843) Pulse Rate:  [74-101] 84 (01/01 0843) Resp:  [15-24] 17 (01/01 0843) BP: (99-119)/(59-80) 104/64 (01/01 0843) SpO2:  [94 %-100 %] 96 % (01/01 0843) FiO2 (%):  [100 %] 100 % (01/01 0800)   Intake/Output: 12/31 0701 - 01/01 0700 In: 3580 [I.V.:3560; NG/GT:20] Out: 3315 [Urine:3055; Emesis/NG output:250; Blood:10] Last BM Date: 04/27/16  Physical Exam: Constitutional: No acute distress Abdomen: Soft, nondistended, appropriately tender to palpation. Midline incision is clean dry and intact with dressing in place. No evidence of infection. NG tube in place with gastric contents in canister.  Labs:   Recent Labs  04/28/16 0119 04/30/16 0530  WBC 6.2 4.4  HGB 8.6* 7.7*  HCT 28.5* 25.1*  PLT 363 319    Recent Labs  04/29/16 0616 04/30/16 0530  NA 136 135  K 3.6 3.2*  CL 108 102  CO2 23 27  GLUCOSE 129* 89  BUN <5* <5*  CREATININE 0.41* 0.55  CALCIUM 8.5* 8.2*   No results for input(s): LABPROT, INR in the last 72 hours.  Imaging: No results found.  Assessment/Plan: 41 year old female status post exploratory laparotomy for small bowel obstruction.  -Continue NG tube to suction while awaiting for return of bowel function. -Continue IV fluid hydration and PCA for pain control. -Ambulate today with assistance as needed.   Melvyn Neth, [place]

## 2016-05-01 LAB — BASIC METABOLIC PANEL
Anion gap: 5 (ref 5–15)
BUN: <5 mg/dL — ABNORMAL LOW (ref 6–20)
CHLORIDE: 104 mmol/L (ref 101–111)
CO2: 25 mmol/L (ref 22–32)
CREATININE: 0.49 mg/dL (ref 0.44–1.00)
Calcium: 8 mg/dL — ABNORMAL LOW (ref 8.9–10.3)
GFR calc non Af Amer: >60 mL/min (ref >60)
Glucose, Bld: 87 mg/dL (ref 65–99)
Potassium: 3.3 mmol/L — ABNORMAL LOW (ref 3.5–5.1)
SODIUM: 134 mmol/L — AB (ref 135–145)

## 2016-05-01 LAB — MAGNESIUM: MAGNESIUM: 2 mg/dL (ref 1.7–2.4)

## 2016-05-01 MED ORDER — KETOROLAC TROMETHAMINE 30 MG/ML IJ SOLN
30.0000 mg | Freq: Four times a day (QID) | INTRAMUSCULAR | Status: DC
  Administered 2016-05-01 – 2016-05-03 (×9): 30 mg via INTRAVENOUS
  Filled 2016-05-01 (×9): qty 1

## 2016-05-01 MED ORDER — PANTOPRAZOLE SODIUM 40 MG IV SOLR
40.0000 mg | INTRAVENOUS | Status: DC
  Administered 2016-05-01 – 2016-05-03 (×3): 40 mg via INTRAVENOUS
  Filled 2016-05-01 (×3): qty 40

## 2016-05-01 MED ORDER — POTASSIUM CHLORIDE CRYS ER 20 MEQ PO TBCR
40.0000 meq | EXTENDED_RELEASE_TABLET | Freq: Once | ORAL | Status: AC
  Administered 2016-05-01: 40 meq via ORAL
  Filled 2016-05-01: qty 2

## 2016-05-01 NOTE — Progress Notes (Signed)
05/01/2016  Subjective: No acute events overnight. Denies any flatus. No nausea or vomiting. Pain is well-controlled with PCA.  Vital signs: Temp:  [98.6 F (37 C)-99.8 F (37.7 C)] 98.6 F (37 C) (01/02 0552) Pulse Rate:  [76-85] 76 (01/02 0552) Resp:  [14-23] 15 (01/02 0841) BP: (100-118)/(61-64) 112/61 (01/02 0552) SpO2:  [94 %-100 %] 94 % (01/02 0841)   Intake/Output: 01/01 0701 - 01/02 0700 In: [id number] [I.V.:1566; IV Piggyback:265] Out: 1700 [Urine:1450; Emesis/NG output:250] Last BM Date: 04/27/16  Physical Exam: Constitutional: No acute distress Abdomen: Soft, nondistended, appropriately tender to palpation over the midline incision. Dressing is been removed revealing an incision with staples that are clean dry and intact. No evidence of infection. NG tube in place with gastric contents.  Labs:   Recent Labs  04/30/16 0530  WBC 4.4  HGB 7.7*  HCT 25.1*  PLT 319    Recent Labs  04/30/16 0530 05/01/16 0456  NA 135 134*  K 3.2* 3.3*  CL 102 104  CO2 27 25  GLUCOSE 89 87  BUN <5* <5*  CREATININE 0.55 0.49  CALCIUM 8.2* 8.0*   No results for input(s): LABPROT, INR in the last 72 hours.  Imaging: No results found.  Assessment/Plan: 41 year old female status post Parker laparotomy for small bowel obstruction.  -Continue nothing by mouth with IV fluid hydration.  -Continue NG tube in place while awaiting for return of bowel function. -Continue PCA for pain control, we'll likely transitioned to IV pain medication tomorrow.   Melvyn Neth, [place]

## 2016-05-02 LAB — BASIC METABOLIC PANEL
ANION GAP: 3 — AB (ref 5–15)
BUN: <5 mg/dL — ABNORMAL LOW (ref 6–20)
CHLORIDE: 104 mmol/L (ref 101–111)
CO2: 27 mmol/L (ref 22–32)
Calcium: 8.1 mg/dL — ABNORMAL LOW (ref 8.9–10.3)
Creatinine, Ser: 0.5 mg/dL (ref 0.44–1.00)
GFR calc non Af Amer: >60 mL/min (ref >60)
Glucose, Bld: 81 mg/dL (ref 65–99)
POTASSIUM: 3.7 mmol/L (ref 3.5–5.1)
Sodium: 134 mmol/L — ABNORMAL LOW (ref 135–145)

## 2016-05-02 LAB — SURGICAL PATHOLOGY

## 2016-05-02 LAB — MAGNESIUM: MAGNESIUM: 2.1 mg/dL (ref 1.7–2.4)

## 2016-05-02 MED ORDER — POTASSIUM CHLORIDE CRYS ER 20 MEQ PO TBCR
40.0000 meq | EXTENDED_RELEASE_TABLET | Freq: Once | ORAL | Status: AC
  Administered 2016-05-02: 40 meq via ORAL
  Filled 2016-05-02: qty 2

## 2016-05-02 MED ORDER — HYDROMORPHONE HCL 1 MG/ML IJ SOLN
0.5000 mg | INTRAMUSCULAR | Status: DC | PRN
  Administered 2016-05-02 – 2016-05-03 (×2): 0.5 mg via INTRAVENOUS
  Filled 2016-05-02 (×2): qty 0.5

## 2016-05-02 NOTE — Progress Notes (Signed)
05/02/2016  Subjective: Patient is 3 Days Post-Op status post exploratory laparotomy for small bowel obstruction. No acute events overnight. Patient started having flatus yesterday and this morning with minimal output from the NG tube. Reports her pain is well-controlled and has not required a PCA much.  Vital signs: Temp:  [98.2 F (36.8 C)-98.7 F (37.1 C)] 98.6 F (37 C) (01/03 1235) Pulse Rate:  [59-76] 62 (01/03 1235) Resp:  [12-18] 18 (01/03 1235) BP: (104-137)/(62-83) 108/64 (01/03 1235) SpO2:  [94 %-100 %] 95 % (01/03 1235)   Intake/Output: 01/02 0701 - 01/03 0700 In: 2335 [P.O.:15; I.V.:2280; NG/GT:40] Out: 1450 [Urine:1350; Emesis/NG output:100] Last BM Date: 04/27/16  Physical Exam: Constitutional: No acute distress Abdomen: Soft, nondistended, appropriately tender to palpation. Incision is clean dry and intact with staples.  Labs:   Recent Labs  04/30/16 0530  WBC 4.4  HGB 7.7*  HCT 25.1*  PLT 319    Recent Labs  05/01/16 0456 05/02/16 0448  NA 134* 134*  K 3.3* 3.7  CL 104 104  CO2 25 27  GLUCOSE 87 81  BUN <5* <5*  CREATININE 0.49 0.50  CALCIUM 8.0* 8.1*   No results for input(s): LABPROT, INR in the last 72 hours.  Imaging: No results found.  Assessment/Plan: 41 year old female status post exploratory laparotomy for small bowel obstruction.  -We'll remove NG tube today and start the patient on sips and ice chips of water. -We'll discontinue PCA and transitioned to IV pain medication. Oral pain medication likely tomorrow as the patient's diet is advanced. -Continue ambulation.   Melvyn Neth, [place]

## 2016-05-03 ENCOUNTER — Encounter: Payer: Self-pay | Admitting: Surgery

## 2016-05-03 LAB — CBC
HCT: 23.7 % — ABNORMAL LOW (ref 35.0–47.0)
Hemoglobin: 7.2 g/dL — ABNORMAL LOW (ref 12.0–16.0)
MCH: 20.9 pg — ABNORMAL LOW (ref 26.0–34.0)
MCHC: 30.4 g/dL — AB (ref 32.0–36.0)
MCV: 68.7 fL — AB (ref 80.0–100.0)
PLATELETS: 400 10*3/uL (ref 150–440)
RBC: 3.44 MIL/uL — ABNORMAL LOW (ref 3.80–5.20)
RDW: 20.5 % — AB (ref 11.5–14.5)
WBC: 2.8 10*3/uL — ABNORMAL LOW (ref 3.6–11.0)

## 2016-05-03 MED ORDER — HYDROMORPHONE HCL 2 MG PO TABS: 2.0000 mg | ORAL_TABLET | ORAL | 0 refills | Status: DC | PRN

## 2016-05-03 MED ORDER — OXYCODONE HCL 5 MG PO TABS
5.0000 mg | ORAL_TABLET | ORAL | Status: DC | PRN
  Filled 2016-05-03: qty 2

## 2016-05-03 MED ORDER — ADULT MULTIVITAMIN W/MINERALS CH
1.0000 | ORAL_TABLET | Freq: Every day | ORAL | Status: DC
  Administered 2016-05-03: 1 via ORAL
  Filled 2016-05-03: qty 1

## 2016-05-03 MED ORDER — HYDROMORPHONE HCL 2 MG PO TABS: 2.0000 mg | ORAL_TABLET | ORAL | Status: DC | PRN

## 2016-05-03 MED ORDER — FERROUS SULFATE 325 (65 FE) MG PO TABS
325.0000 mg | ORAL_TABLET | Freq: Every day | ORAL | Status: DC
  Administered 2016-05-03: 325 mg via ORAL
  Filled 2016-05-03: qty 1

## 2016-05-03 MED ORDER — FERROUS SULFATE 325 (65 FE) MG PO TABS: 325.0000 mg | ORAL_TABLET | Freq: Every day | ORAL | 3 refills | Status: DC

## 2016-05-03 MED ORDER — IBUPROFEN 600 MG PO TABS: 600.0000 mg | ORAL_TABLET | Freq: Three times a day (TID) | ORAL | 0 refills | Status: DC | PRN

## 2016-05-03 NOTE — Progress Notes (Signed)
Discharged patient home with husband in stable condition, reviewed discharge instructions and medications with patient, removed IV access

## 2016-05-03 NOTE — Plan of Care (Signed)
Problem: Skin Integrity: Goal: Risk for impaired skin integrity will decrease Outcome: Progressing Patient educated on need to maintain skin integrity by keeping surgical site clean and dry

## 2016-05-03 NOTE — Progress Notes (Addendum)
05/03/2016  Subjective: Patient is 4 Days Post-Op status post exploratory laparotomy for small bowel obstruction. No acute events overnight. Patient's pain is well-controlled. She was tolerating clear liquid diet with no issues. Passing flatus. Denies any fevers, nausea, vomiting.  Vital signs: Temp:  [98.5 F (36.9 C)-98.8 F (37.1 C)] 98.8 F (37.1 C) (01/04 0515) Pulse Rate:  [60-72] 63 (01/04 0515) Resp:  [18-20] 19 (01/04 0515) BP: (105-114)/(62-66) 114/62 (01/04 0515) SpO2:  [94 %-100 %] 100 % (01/04 0515)   Intake/Output: 01/03 0701 - 01/04 0700 In: [id number] [I.V.:1433] Out: 1400 [Urine:1400] Last BM Date: 04/27/16  Physical Exam: Constitutional: No acute distress Abdomen:  Soft, nondistended, properly tender to palpation. Incision is clean dry and intact with staples.  Labs:   Recent Labs  05/03/16 0526  WBC 2.8*  HGB 7.2*  HCT 23.7*  PLT 400    Recent Labs  05/01/16 0456 05/02/16 0448  NA 134* 134*  K 3.3* 3.7  CL 104 104  CO2 25 27  GLUCOSE 87 81  BUN <5* <5*  CREATININE 0.49 0.50  CALCIUM 8.0* 8.1*   No results for input(s): LABPROT, INR in the last 72 hours.  Imaging: No results found.  Assessment/Plan: 41 year old female status post exploratory laparotomy.  -Advance patient's diet to regular this morning. -Discontinue IV fluids. -Transitioned to oral pain medication. -Possible discharge to home this afternoon if the patient is doing well.   Melvyn Neth, [place]

## 2016-05-03 NOTE — Discharge Summary (Signed)
Patient ID: Catherine Hunter MRN: [id number] DOB/AGE: 03-Jun-1975 41 y.o.  Admit date: 04/28/2016 Discharge date: 05/03/2016   Discharge Diagnoses:  Active Problems:   SBO (small bowel obstruction)   Procedures: Exploratory laparotomy, lysis of adhesions, and appendectomy  Hospital Course:  Patient was admitted on 04/28/16 with a small bowel obstruction.  She was taken to the operating room on 12/31 due to no improvement despite NG tube decompression.  She tolerated the procedure well.  Post-operatively, she was NPO with IV fluid hydration and NG tube in place, with PCA for pain control.  She has return of bowel function by POD#3 and NG tube was removed.  She was started on clears and advanced to regular diet.  PCA was transitioned to po medications.  She was ambulating, tolerating a regular diet, with bowel function, voiding without issues, and her pain was well controlled.  She was deemed ready for discharge to home.  Consults: None  Disposition: 01-Home or Self Care  Discharge Instructions    Call MD for:  difficulty breathing, headache or visual disturbances    Complete by:  As directed    Call MD for:  persistant nausea and vomiting    Complete by:  As directed    Call MD for:  redness, tenderness, or signs of infection (pain, swelling, redness, odor or green/yellow discharge around incision site)    Complete by:  As directed    Call MD for:  severe uncontrolled pain    Complete by:  As directed    Call MD for:  temperature >100.4    Complete by:  As directed    Diet - low sodium heart healthy    Complete by:  As directed    Discharge instructions    Complete by:  As directed    Patient may shower, but do not scrub wounds heavily and dab dry only.  Do not remove staples.   Driving Restrictions    Complete by:  As directed    Do not drive while taking narcotics for pain control.   Increase activity slowly    Complete by:  As directed    Lifting restrictions    Complete by:   As directed    No heavy lifting of more than 10-15 lbs for 4 weeks.   No dressing needed    Complete by:  As directed      Allergies as of 05/03/2016      Reactions   Hydrocodone Hives, Itching   Oxycodone Hives, Itching      Medication List    TAKE these medications   cholecalciferol 1000 units tablet Commonly known as:  VITAMIN D Take 1,000 Units by mouth daily.   ferrous sulfate 325 (65 FE) MG tablet Take 1 tablet (325 mg total) by mouth daily with breakfast. Start taking on:  05/04/2016   HYDROmorphone 2 MG tablet Commonly known as:  DILAUDID Take 1 tablet (2 mg total) by mouth every 4 (four) hours as needed for moderate pain or severe pain.   ibuprofen 600 MG tablet Commonly known as:  ADVIL,MOTRIN Take 1 tablet (600 mg total) by mouth every 8 (eight) hours as needed for fever or mild pain.   magnesium citrate Soln Take 296 mLs (1 Bottle total) by mouth once.   multivitamin with minerals tablet Take 1 tablet by mouth daily.   ondansetron 4 MG tablet Commonly known as:  ZOFRAN Take 1 tablet (4 mg total) by mouth daily as needed for nausea or vomiting.  Follow-up Information    Phoebe Perch, MD Follow up in 1 week(s).   Specialty:  Surgery Why:  for staple removal Contact information: [address] [place] [address] [phone]

## 2016-05-04 ENCOUNTER — Telehealth: Payer: Self-pay

## 2016-05-07 ENCOUNTER — Encounter: Payer: Self-pay | Admitting: General Surgery

## 2016-05-07 ENCOUNTER — Ambulatory Visit (INDEPENDENT_AMBULATORY_CARE_PROVIDER_SITE_OTHER): Payer: BLUE CROSS/BLUE SHIELD | Admitting: General Surgery

## 2016-05-07 VITALS — BP 106/68 | HR 80 | Temp 98.7°F | Ht 61.0 in | Wt 139.6 lb

## 2016-05-07 DIAGNOSIS — Z4889 Encounter for other specified surgical aftercare: Secondary | ICD-10-CM

## 2016-05-07 MED ORDER — AMOXICILLIN-POT CLAVULANATE 875-125 MG PO TABS: 1.0000 | ORAL_TABLET | Freq: Two times a day (BID) | ORAL | 0 refills | Status: DC

## 2016-05-07 NOTE — Telephone Encounter (Signed)
Patient called in and states that she has purulent drainage from bottom of incision. Denies nausea/vomiting or fever/chills. States that incision is not red.  Patient placed on schedule for today to see Dr. Adonis Huguenin. Patient in agreement.

## 2016-05-07 NOTE — Progress Notes (Signed)
Outpatient Surgical Follow Up  05/07/2016  Catherine Hunter is an 41 y.o. female.   Chief Complaint  Patient presents with  . Routine Post Op    HPI: 41 year old female returns to clinic today for follow-up secondary to increased drainage from the lower part of her midline incision. She states over the weekend it changed in color to become more cloudy and greenish. This concerned her and a portion call clinic this morning at a point was made. She denies any fevers, chills, nausea, vomiting, chest pain, shortness of breath. She has been a real eating well and otherwise that she is healing fine.  Past Medical History:  Diagnosis Date  . Anemia   . Asthma     Past Surgical History:  Procedure Laterality Date  . HERNIA REPAIR    . LAPAROSCOPIC GASTRIC SLEEVE RESECTION    . LAPAROTOMY N/A 04/29/2016   Procedure: EXPLORATORY LAPAROTOMY and appendectomy;  Surgeon: Florene Glen, MD;  Location: [facility] ORS;  Service: General;  Laterality: N/A;    History reviewed. No pertinent family history.  Social History:  reports that she has been smoking.  She has a 1.00 pack-year smoking history. She has never used smokeless tobacco. She reports that she drinks about 1.2 oz of alcohol per week . She reports that she does not use drugs.  Allergies:  Allergies  Allergen Reactions  . Hydrocodone Hives and Itching  . Oxycodone Hives and Itching    Medications reviewed.    ROS A multipoint review of systems was completed. All pertinent positives and negatives are documented within the history of present illness and remainder are negative.   BP 106/68   Pulse 80   Temp 98.7 F (37.1 C) (Oral)   Ht 5\' 1"  (1.549 m)   Wt 63.3 kg (139 lb 9.6 oz)   BMI 26.38 kg/m   Physical Exam Gen.: No acute distress Abdomen: Soft, appropriately tender to midline, nondistended. The most caudad aspect of the incision shows a green tinged serous fluid. A single staple was removed revealing a pocket of fluid  that was easily expressed. The wound was probed with a cotton tip applicator and did not appear to tunnel or go any deeper.    No results found for this or any previous visit (from the past 48 hour(s)). No results found.  Assessment/Plan:  1. Aftercare following surgery 41 year old female 9 days status post export her laparotomy for bowel obstruction. Now that appears to be a seroma or infected seroma on the most caudad aspect of her incision. Single staple was removed and the wound was probed. Discussed with the patient to do twice a day dressing changes and to expect increase in its drainage. I will start her on Augmentin for prophylaxis and instructed her to keep her follow-up appointment later this week for additional wound check.     Clayburn Pert, MD FACS General Surgeon  05/07/2016,9:18 AM

## 2016-05-07 NOTE — Patient Instructions (Addendum)
Please pick up your medicine at the pharmacy. Please keep your appointment with Dr.Woodham 05/10/16. Please call our office if you have questions or concerns.

## 2016-05-10 ENCOUNTER — Telehealth: Payer: Self-pay

## 2016-05-10 ENCOUNTER — Ambulatory Visit (INDEPENDENT_AMBULATORY_CARE_PROVIDER_SITE_OTHER): Payer: BLUE CROSS/BLUE SHIELD | Admitting: General Surgery

## 2016-05-10 ENCOUNTER — Encounter: Payer: Self-pay | Admitting: General Surgery

## 2016-05-10 ENCOUNTER — Ambulatory Visit: Payer: Self-pay | Admitting: Surgery

## 2016-05-10 VITALS — BP 107/71 | HR 85 | Temp 98.5°F | Ht 61.0 in | Wt 140.8 lb

## 2016-05-10 DIAGNOSIS — Z4889 Encounter for other specified surgical aftercare: Secondary | ICD-10-CM

## 2016-05-10 NOTE — Progress Notes (Signed)
Outpatient Surgical Follow Up  05/10/2016  Catherine Hunter is an 41 y.o. female.   Chief Complaint  Patient presents with  . Routine Post Op    Laparotomy with Lysis of Adhsions and Appendectomy (04/29/16)- Dr. Burt Knack    HPI: 41 year old female returns to clinic 2 weeks status post export her laparotomy for small bowel obstruction. Reports that she continues to have a little bit of clear drainage from her incision but otherwise is doing very well. Her pain is well-controlled and she denies any fevers, chills, nausea, vomiting, chest pain, shortness of breath, diarrhea, constipation.  Past Medical History:  Diagnosis Date  . Anemia   . Asthma     Past Surgical History:  Procedure Laterality Date  . CESAREAN SECTION  1997  . ECTOPIC PREGNANCY SURGERY  2000   x2  . HERNIA REPAIR    . LAPAROSCOPIC GASTRIC SLEEVE RESECTION  07/20/2014  . LAPAROTOMY N/A 04/29/2016   Procedure: EXPLORATORY LAPAROTOMY and appendectomy;  Surgeon: Florene Glen, MD;  Location: [facility] ORS;  Service: General;  Laterality: N/A;    Family History  Problem Relation Age of Onset  . Hypertension Mother   . Hypertension Father   . Throat cancer Father   . Heart disease Father   . Hypertension Sister   . Hypertension Sister     Social History:  reports that she has been smoking.  She has a 1.00 pack-year smoking history. She has never used smokeless tobacco. She reports that she drinks alcohol. She reports that she does not use drugs.  Allergies:  Allergies  Allergen Reactions  . Hydrocodone Hives and Itching  . Oxycodone Hives and Itching    Medications reviewed.    ROS A multipoint review of systems was completed. All pertinent positives and negatives are documented within the history of present illness the remainder are negative.   BP 107/71   Pulse 85   Temp 98.5 F (36.9 C) (Oral)   Ht 5\' 1"  (1.549 m)   Wt 63.9 kg (140 lb 12.8 oz)   BMI 26.60 kg/m   Physical Exam Gen.: No acute  distress Chest: Clear to auscultation Heart: Regular rhythm Abdomen: Soft, appropriately tender to palpation midline, nondistended. Lower aspect of the incision that was opened last visit shows healthy-appearing granulation tissue. There is no purulent output or spreading erythema. There is no expressible drainage and a relatively long incision.    No results found for this or any previous visit (from the past 48 hour(s)). No results found.  Assessment/Plan:  1. Aftercare following surgery 41 year old female 2 weeks status post export her laparotomy. Remainder staples removed today without difficulty and replaced with Steri-Strips. Again counseled the patient as to the signs and symptoms of purulent drainage or spreading infection and return to clinic medially should they occur. She voiced understanding and will follow-up in clinic next week with her operative surgeon for an additional wound check. She was started on antibiotics last visit for the suspected infected seroma and she was instructed to complete the course.     Clayburn Pert, MD FACS General Surgeon  05/10/2016,1:33 PM

## 2016-05-10 NOTE — Telephone Encounter (Signed)
FMLA Paperwork has been received and a $25.00 collection fee has been obtained.    

## 2016-05-10 NOTE — Patient Instructions (Signed)
We have removed staples today. Please see your follow up with Dr.Cooper listed below. Please call our office if you have any questions or concerns.

## 2016-05-10 NOTE — Telephone Encounter (Signed)
Patient's FMLA Form was filled out and faxed to her supervisor Darlene from [facility].

## 2016-05-14 ENCOUNTER — Ambulatory Visit (INDEPENDENT_AMBULATORY_CARE_PROVIDER_SITE_OTHER): Payer: BLUE CROSS/BLUE SHIELD | Admitting: Surgery

## 2016-05-14 ENCOUNTER — Encounter: Payer: Self-pay | Admitting: Surgery

## 2016-05-14 VITALS — BP 112/70 | HR 89 | Temp 98.6°F | Resp 20 | Ht 61.0 in | Wt 140.6 lb

## 2016-05-14 DIAGNOSIS — Z4889 Encounter for other specified surgical aftercare: Secondary | ICD-10-CM

## 2016-05-14 NOTE — Patient Instructions (Signed)
Please follow-up as scheduled below.  You will want to keep a dressing over this area because the medication that we placed on your wound today will stain your clothes if it gets on them.   Please call with any questions or concerns prior to your appointment.

## 2016-05-14 NOTE — Progress Notes (Signed)
Outpatient postop visit  05/14/2016  Catherine Hunter is an 41 y.o. female.    Procedure: Lysis of adhesions for small bowel obstruction  CC: Drainage  HPI: This patient underwent an exploratory laparotomy and lysis of adhesions for small bowel obstruction. She was also noted to have extensive uterine fibroids. Patient is tolerating a regular diet with no nausea or vomiting she's having some loose stools finished antibiotics last night. Medications reviewed.    Physical Exam:  There were no vitals taken for this visit.    PE:  Area of skin edge mismatch caudad and a larger area at the periumbilical area draining some serous fluid only no erythema no purulence   Assessment/Plan:  Counseled about uterine fibroids and the need for GYN.  Status post exploratory laparotomy and lysis of adhesions for small bowel obstruction. Minimal amount of drainage from 2 separate areas of skin edge mismatch. Silver nitrate is applied. Dressing is replaced. I will see her back on Thursday and repeat silver nitrate.  Florene Glen, MD, FACS

## 2016-05-17 ENCOUNTER — Encounter: Payer: Self-pay | Admitting: Surgery

## 2016-05-21 ENCOUNTER — Encounter: Payer: Self-pay | Admitting: Surgery

## 2016-05-21 ENCOUNTER — Ambulatory Visit (INDEPENDENT_AMBULATORY_CARE_PROVIDER_SITE_OTHER): Payer: BLUE CROSS/BLUE SHIELD | Admitting: Surgery

## 2016-05-21 ENCOUNTER — Telehealth: Payer: Self-pay

## 2016-05-21 VITALS — BP 122/89 | HR 90 | Temp 98.6°F | Ht >= 80 in | Wt 139.4 lb

## 2016-05-21 DIAGNOSIS — Z4889 Encounter for other specified surgical aftercare: Secondary | ICD-10-CM

## 2016-05-21 NOTE — Telephone Encounter (Signed)
Return to work note faxed to [facility] at this time. [phone] per patient.

## 2016-05-21 NOTE — Patient Instructions (Signed)
Please follow-up as scheduled below.  You may return to work tomorrow. Please see note provided.

## 2016-05-21 NOTE — Progress Notes (Signed)
Outpatient postop visit  05/21/2016  Catherine Hunter is an 41 y.o. female.    Procedure: Lysis of adhesions  CC: much less drainage  HPI: This patient status post exploratory laparotomy and lysis of adhesions for small bowel obstruction. Last week she had some drainage from her wound and I asked her to come back this week to be reevaluated. Patient states she is experiencing much less drainage and much improved. She has no pain. She last changed her dressing last night  Medications reviewed.    Physical Exam:  LMP 05/01/2016 (Within Days)     PE: Minimal fibrinopurulent exudate over the caudad wound as well as the periumbilical wound  Silver nitrate is applied to both wounds where skin edge mismatch is the overlying problem.    Assessment/Plan:  Wounds are much improved and healing well Silver nitrate is been applied. I will see her back next week and repeat silver nitrate. She can go to work with the lifting restrictions discussed previously.  Florene Glen, MD, FACS

## 2016-05-29 ENCOUNTER — Ambulatory Visit (INDEPENDENT_AMBULATORY_CARE_PROVIDER_SITE_OTHER): Payer: BLUE CROSS/BLUE SHIELD | Admitting: Surgery

## 2016-05-29 ENCOUNTER — Encounter: Payer: Self-pay | Admitting: Surgery

## 2016-05-29 VITALS — BP 125/78 | HR 82 | Temp 98.8°F | Ht 61.0 in | Wt 141.2 lb

## 2016-05-29 DIAGNOSIS — Z4889 Encounter for other specified surgical aftercare: Secondary | ICD-10-CM

## 2016-05-29 NOTE — Progress Notes (Signed)
Status post exploratory laparotomy with much less drainage from abdominal wound. No purulence. Patient is back to working but not doing any heavy lifting.  Wound is clean the more cephalad wound is completely closed the caudad wound is much smaller with some granulation tissue and minimal if any clear drainage. A purulence and no erythema.  Silver nitrate is applied.  We'll see patient week.

## 2016-05-29 NOTE — Patient Instructions (Signed)
Please call our office if you have any questions or concerns.  

## 2016-08-03 ENCOUNTER — Emergency Department
Admission: EM | Admit: 2016-08-03 | Discharge: 2016-08-03 | Disposition: A | Discharge status: Home / Self Care | Payer: BLUE CROSS/BLUE SHIELD | Attending: Emergency Medicine | Admitting: Emergency Medicine

## 2016-08-03 ENCOUNTER — Emergency Department: Payer: BLUE CROSS/BLUE SHIELD

## 2016-08-03 ENCOUNTER — Encounter: Payer: Self-pay | Admitting: Radiology

## 2016-08-03 DIAGNOSIS — R110 Nausea: Secondary | ICD-10-CM | POA: Diagnosis not present

## 2016-08-03 DIAGNOSIS — J45909 Unspecified asthma, uncomplicated: Secondary | ICD-10-CM | POA: Insufficient documentation

## 2016-08-03 DIAGNOSIS — R1084 Generalized abdominal pain: Secondary | ICD-10-CM | POA: Diagnosis present

## 2016-08-03 DIAGNOSIS — F17200 Nicotine dependence, unspecified, uncomplicated: Secondary | ICD-10-CM | POA: Diagnosis not present

## 2016-08-03 LAB — CBC
HCT: 24.1 % — ABNORMAL LOW (ref 35.0–47.0)
Hemoglobin: 7.3 g/dL — ABNORMAL LOW (ref 12.0–16.0)
MCH: 20 pg — ABNORMAL LOW (ref 26.0–34.0)
MCHC: 30.3 g/dL — ABNORMAL LOW (ref 32.0–36.0)
MCV: 65.9 fL — ABNORMAL LOW (ref 80.0–100.0)
PLATELETS: 382 10*3/uL (ref 150–440)
RBC: 3.65 MIL/uL — ABNORMAL LOW (ref 3.80–5.20)
RDW: 18.9 % — AB (ref 11.5–14.5)
WBC: 4.8 10*3/uL (ref 3.6–11.0)

## 2016-08-03 LAB — URINALYSIS, COMPLETE (UACMP) WITH MICROSCOPIC
Bacteria, UA: NONE SEEN
Bilirubin Urine: NEGATIVE
GLUCOSE, UA: NEGATIVE mg/dL
HGB URINE DIPSTICK: NEGATIVE
Ketones, ur: 5 mg/dL — AB
Leukocytes, UA: NEGATIVE
NITRITE: NEGATIVE
Protein, ur: NEGATIVE mg/dL
SPECIFIC GRAVITY, URINE: 1.02 (ref 1.005–1.030)
pH: 6 (ref 5.0–8.0)

## 2016-08-03 LAB — COMPREHENSIVE METABOLIC PANEL
ALK PHOS: 59 U/L (ref 38–126)
ALT: 16 U/L (ref 14–54)
AST: 21 U/L (ref 15–41)
Albumin: 3.7 g/dL (ref 3.5–5.0)
Anion gap: 10 (ref 5–15)
BILIRUBIN TOTAL: 0.2 mg/dL — AB (ref 0.3–1.2)
BUN: 13 mg/dL (ref 6–20)
CALCIUM: 9 mg/dL (ref 8.9–10.3)
CHLORIDE: 104 mmol/L (ref 101–111)
CO2: 23 mmol/L (ref 22–32)
CREATININE: 0.6 mg/dL (ref 0.44–1.00)
GFR calc Af Amer: >60 mL/min (ref >60)
GLUCOSE: 98 mg/dL (ref 65–99)
Potassium: 3.5 mmol/L (ref 3.5–5.1)
Sodium: 137 mmol/L (ref 135–145)
TOTAL PROTEIN: 8 g/dL (ref 6.5–8.1)

## 2016-08-03 LAB — POCT PREGNANCY, URINE
Preg Test, Ur: NEGATIVE
Preg Test, Ur: NEGATIVE

## 2016-08-03 LAB — LIPASE, BLOOD: LIPASE: 19 U/L (ref 11–51)

## 2016-08-03 MED ORDER — IOPAMIDOL (ISOVUE-300) INJECTION 61%
100.0000 mL | Freq: Once | INTRAVENOUS | Status: AC | PRN
  Administered 2016-08-03: 100 mL via INTRAVENOUS

## 2016-08-03 MED ORDER — MORPHINE SULFATE (PF) 4 MG/ML IV SOLN
4.0000 mg | Freq: Once | INTRAVENOUS | Status: AC
  Administered 2016-08-03: 4 mg via INTRAVENOUS

## 2016-08-03 MED ORDER — HEPARIN (PORCINE) IN NACL 2-0.9 UNIT/ML-% IJ SOLN
INTRAMUSCULAR | Status: AC
Start: 2016-08-03 — End: ?
  Filled 2016-08-03: qty 500

## 2016-08-03 MED ORDER — FENTANYL CITRATE (PF) 100 MCG/2ML IJ SOLN
INTRAMUSCULAR | Status: AC
  Filled 2016-08-03: qty 2

## 2016-08-03 MED ORDER — ONDANSETRON HCL 4 MG/2ML IJ SOLN
4.0000 mg | Freq: Once | INTRAMUSCULAR | Status: AC
Start: 2016-08-03 — End: 2016-08-03
  Administered 2016-08-03: 4 mg via INTRAVENOUS

## 2016-08-03 MED ORDER — MORPHINE SULFATE (PF) 4 MG/ML IV SOLN
INTRAVENOUS | Status: AC
  Administered 2016-08-03: 4 mg via INTRAVENOUS
  Filled 2016-08-03: qty 1

## 2016-08-03 MED ORDER — TRAMADOL HCL 50 MG PO TABS: 50.0000 mg | ORAL_TABLET | Freq: Four times a day (QID) | ORAL | 0 refills | Status: DC | PRN

## 2016-08-03 MED ORDER — KETOROLAC TROMETHAMINE 30 MG/ML IJ SOLN
30.0000 mg | Freq: Once | INTRAMUSCULAR | Status: AC
  Administered 2016-08-03: 30 mg via INTRAVENOUS
  Filled 2016-08-03: qty 1

## 2016-08-03 MED ORDER — ONDANSETRON HCL 4 MG/2ML IJ SOLN
INTRAMUSCULAR | Status: AC
  Administered 2016-08-03: 4 mg via INTRAVENOUS
  Filled 2016-08-03: qty 2

## 2016-08-03 MED ORDER — LABETALOL HCL 5 MG/ML IV SOLN
INTRAVENOUS | Status: AC
  Filled 2016-08-03: qty 4

## 2016-08-03 MED ORDER — IOPAMIDOL (ISOVUE-300) INJECTION 61%
30.0000 mL | Freq: Once | INTRAVENOUS | Status: AC | PRN
  Administered 2016-08-03: 30 mL via ORAL

## 2016-08-03 MED ORDER — MIDAZOLAM HCL 2 MG/2ML IJ SOLN
INTRAMUSCULAR | Status: AC
  Filled 2016-08-03: qty 2

## 2016-08-03 NOTE — ED Provider Notes (Signed)
[facility] Emergency Department Provider Note   First MD Initiated Contact with Patient 08/03/16 [phone]     (approximate)  I have reviewed the triage vital signs and the nursing notes.   HISTORY  Chief Complaint Abdominal Pain   HPI Catherine Hunter is a 41 y.o. female below list of chronic medical conditions including recent small bowel obstruction 04/28/2016 presents to the emergency department with epigastric/mid abdominal pain associated with nausea. Patient denies any vomiting no constipation diarrhea. Patient denies any fever. Patient states symptoms of been occurring for the past 3 days but with acute worsening last night current pain score is 10 out of 10.   Past Medical History:  Diagnosis Date  . Anemia   . Asthma     Patient Active Problem List   Diagnosis Date Noted  . SBO (small bowel obstruction) 04/28/2016  . Leukopenia 10/06/2015    Past Surgical History:  Procedure Laterality Date  . CESAREAN SECTION  1997  . ECTOPIC PREGNANCY SURGERY  2000   x2  . HERNIA REPAIR    . LAPAROSCOPIC GASTRIC SLEEVE RESECTION  07/20/2014  . LAPAROTOMY N/A 04/29/2016   Procedure: EXPLORATORY LAPAROTOMY and appendectomy;  Surgeon: Florene Glen, MD;  Location: [facility] ORS;  Service: General;  Laterality: N/A;    Prior to Admission medications   Medication Sig Start Date End Date Taking? Authorizing Provider  cholecalciferol (VITAMIN D) 1000 UNITS tablet Take 1,000 Units by mouth daily.    Historical Provider, MD  ferrous sulfate 325 (65 FE) MG tablet Take 1 tablet (325 mg total) by mouth daily with breakfast. 05/04/16   Olean Ree, MD  ibuprofen (ADVIL,MOTRIN) 600 MG tablet Take 1 tablet (600 mg total) by mouth every 8 (eight) hours as needed for fever or mild pain. 05/03/16   Olean Ree, MD  Multiple Vitamins-Minerals (MULTIVITAMIN WITH MINERALS) tablet Take 1 tablet by mouth daily.    Historical Provider, MD    Allergies Hydrocodone and  Oxycodone  Family History  Problem Relation Age of Onset  . Hypertension Mother   . Hypertension Father   . Throat cancer Father   . Heart disease Father   . Hypertension Sister   . Hypertension Sister     Social History Social History  Substance Use Topics  . Smoking status: Current Some Day Smoker    Packs/day: 0.25    Years: 4.00  . Smokeless tobacco: Never Used  . Alcohol use Yes     Comment: 2 drinks wine/ weekly    Review of Systems Constitutional: No fever/chills Eyes: No visual changes. ENT: No sore throat. Cardiovascular: Denies chest pain. Respiratory: Denies shortness of breath. Gastrointestinal: Positive for abdominal pain and nausea.  Genitourinary: Negative for dysuria. Musculoskeletal: Negative for back pain. Skin: Negative for rash. Neurological: Negative for headaches, focal weakness or numbness.  10-point ROS otherwise negative.  ____________________________________________   PHYSICAL EXAM:  VITAL SIGNS: ED Triage Vitals  Enc Vitals Group     BP 08/03/16 0602 110/64     Pulse Rate 08/03/16 0602 84     Resp 08/03/16 0602 18     Temp 08/03/16 0602 98.2 F (36.8 C)     Temp Source 08/03/16 0602 Oral     SpO2 08/03/16 0602 99 %     Weight 08/03/16 0603 139 lb (63 kg)     Height 08/03/16 0603 5\' 1"  (1.549 m)     Head Circumference --      Peak Flow --  Pain Score 08/03/16 0602 10     Pain Loc --      Pain Edu? --      Excl. in [place]? --     Constitutional: Alert and oriented. Apparent discomfort Eyes: Conjunctivae are normal. PERRL. EOMI. Mouth/Throat: Mucous membranes are moist. Oropharynx non-erythematous. Neck: No stridor.   Cardiovascular: Normal rate, regular rhythm. Good peripheral circulation. Grossly normal heart sounds. Respiratory: Normal respiratory effort.  No retractions. Lungs CTAB. Gastrointestinal: Generalized tenderness to palpation worse in the epigastrium region. Positive abdominal distention. Musculoskeletal: No  lower extremity tenderness nor edema. No gross deformities of extremities. Neurologic:  Normal speech and language. No gross focal neurologic deficits are appreciated.  Skin:  Skin is warm, dry and intact. No rash noted.   ____________________________________________   LABS (all labs ordered are listed, but only abnormal results are displayed)  Labs Reviewed  LIPASE, BLOOD  COMPREHENSIVE METABOLIC PANEL  CBC  URINALYSIS, COMPLETE (UACMP) WITH MICROSCOPIC   ____________________________________________  EKG  ED ECG REPORT I, [place] Jendayi Berling, the attending physician, personally viewed and interpreted this ECG.   Date: 08/03/2016  EKG Time: 6:10 AM  Rate: 90  Rhythm: Normal sinus rhythm  Axis: Normal  Intervals: Normal  ST&T Change: None  ______________   Procedures   ____________________________________________   INITIAL IMPRESSION / ASSESSMENT AND PLAN / ED COURSE  Pertinent labs & imaging results that were available during my care of the patient were reviewed by me and considered in my medical decision making (see chart for details).  41 year old female with generalized abdominal pain worsening the epigastrium region with nausea concern for possible pancreatitis versus bowel obstruction.  Patient given morphine 4 mg and Zofran 4 mg analgesia and antiemetic respectively with improvement of both.Patient's care transferred to Dr. Corky Downs    ____________________________________________  FINAL CLINICAL IMPRESSION(S) / ED DIAGNOSES  Final diagnoses:  Generalized abdominal pain  Nausea     MEDICATIONS GIVEN DURING THIS VISIT:  Medications  ondansetron (ZOFRAN) injection 4 mg (4 mg Intravenous Given 08/03/16 0624)  morphine 4 MG/ML injection 4 mg (4 mg Intravenous Given 08/03/16 0624)     NEW OUTPATIENT MEDICATIONS STARTED DURING THIS VISIT:  New Prescriptions   No medications on file    Modified Medications   No medications on file    Discontinued  Medications   No medications on file     Note:  This document was prepared using Dragon voice recognition software and may include unintentional dictation errors.    Gregor Hams, MD 08/03/16 [phone]

## 2016-08-03 NOTE — ED Provider Notes (Signed)
CT scan unremarkable. Patient reports improvement but still some discomfort. Offered admission but she refused and wants to go home. Emphasized strong/strict return precautions.   Lavonia Drafts, MD 08/03/16 1054

## 2016-08-03 NOTE — ED Notes (Signed)
Pt verbalized understanding of discharge instructions. NAD at this time. 

## 2016-08-03 NOTE — ED Triage Notes (Signed)
Pt in with co epigastric pain x 3 days, states hx of the same but has never been seen for it.

## 2016-08-15 ENCOUNTER — Encounter: Payer: Self-pay | Admitting: Surgery

## 2016-08-15 ENCOUNTER — Telehealth: Payer: Self-pay

## 2016-08-15 ENCOUNTER — Ambulatory Visit (INDEPENDENT_AMBULATORY_CARE_PROVIDER_SITE_OTHER): Payer: BLUE CROSS/BLUE SHIELD | Admitting: Surgery

## 2016-08-15 VITALS — BP 137/87 | HR 94 | Temp 98.9°F | Wt 141.0 lb

## 2016-08-15 DIAGNOSIS — R109 Unspecified abdominal pain: Secondary | ICD-10-CM | POA: Diagnosis not present

## 2016-08-15 MED ORDER — POLYETHYLENE GLYCOL 3350 17 GM/SCOOP PO POWD: 1.0000 | Freq: Once | ORAL | 0 refills | Status: AC

## 2016-08-15 NOTE — Progress Notes (Signed)
Outpatient Surgical Follow Up  08/15/2016  Catherine Hunter is an 41 y.o. female.   CC: Abdominal pain  HPI: This patient underwent an exploratory laparotomy with incidental appendectomy and adhesiolysis for bowel obstruction in December 2017.  Patient describes periumbilical pain since surgery. She's not noticing a bulge she got lost any weight she is able to eat and able to do her normal activities the pain comes and goes denies fevers or chills  Past Medical History:  Diagnosis Date  . Anemia   . Asthma     Past Surgical History:  Procedure Laterality Date  . CESAREAN SECTION  1997  . ECTOPIC PREGNANCY SURGERY  2000   x2  . HERNIA REPAIR    . LAPAROSCOPIC GASTRIC SLEEVE RESECTION  07/20/2014  . LAPAROTOMY N/A 04/29/2016   Procedure: EXPLORATORY LAPAROTOMY and appendectomy;  Surgeon: Florene Glen, MD;  Location: [facility] ORS;  Service: General;  Laterality: N/A;    Family History  Problem Relation Age of Onset  . Hypertension Mother   . Hypertension Father   . Throat cancer Father   . Heart disease Father   . Hypertension Sister   . Hypertension Sister     Social History:  reports that she has been smoking.  She has a 1.00 pack-year smoking history. She has never used smokeless tobacco. She reports that she drinks alcohol. She reports that she does not use drugs.  Allergies:  Allergies  Allergen Reactions  . Hydrocodone Hives and Itching  . Oxycodone Hives and Itching    Medications reviewed.   Review of Systems:   Review of Systems  Constitutional: Negative for chills, fever and weight loss.  HENT: Negative.   Eyes: Negative.   Respiratory: Negative.   Cardiovascular: Negative.   Gastrointestinal: Positive for abdominal pain, constipation and nausea. Negative for blood in stool, diarrhea, heartburn, melena and vomiting.  Genitourinary: Negative.   Musculoskeletal: Negative.   Skin: Negative.   Neurological: Negative.   Endo/Heme/Allergies: Negative.    Psychiatric/Behavioral: Negative.      Physical Exam:  LMP 07/22/2016   Physical Exam  Constitutional: She is oriented to person, place, and time and well-developed, well-nourished, and in no distress. No distress.  HENT:  Head: Normocephalic and atraumatic.  Eyes: Pupils are equal, round, and reactive to light. Right eye exhibits no discharge. Left eye exhibits no discharge. No scleral icterus.  Neck: Normal range of motion.  Cardiovascular: Normal rate and regular rhythm.   Pulmonary/Chest: Effort normal. No respiratory distress.  Abdominal: Soft. She exhibits no distension. There is no tenderness. There is no rebound and no guarding.  Abdominal exam is completely nontender and soft. No obvious hernia at the umbilicus. No obvious sign of hernia however  Musculoskeletal: Normal range of motion. She exhibits no edema.  Lymphadenopathy:    She has no cervical adenopathy.  Neurological: She is alert and oriented to person, place, and time.  Skin: Skin is warm and dry. No rash noted. She is not diaphoretic. No erythema.  Psychiatric: Mood and affect normal.  Vitals reviewed.     No results found for this or any previous visit (from the past 48 hour(s)). No results found.  Assessment/Plan:   CT scan is personally reviewed. There is considerable signs of constipation. There are also signs of calcified necrotic uterine fibroids with a very enlarged uterus. There seems to be compression on the urinary bladder and the rectum and the fundus of the uterus with multiple fibroids extend towards the outlet of  the pelvic brim.  I discussed with the patient the need for gynecologic referral as well as treating her constipation this may be a combination of things causing her symptoms but I see no sign of hernia on the CT scan or on physical exam and I see no sign of bowel obstruction. We'll see her back when she is seen in the ectatic cardiologist. Florene Glen, MD, FACS

## 2016-08-15 NOTE — Patient Instructions (Addendum)
Please mix 255 G of Miralax with 64 ounces of Gatorade (any flavor). Then drink 8 oz. Every 15-20 minutes until it's all gone. Try to stay home when doing this.  We will send a referral to an OB/GYN to discuss your multiple uterine fibroids.

## 2016-08-15 NOTE — Telephone Encounter (Signed)
I have put in an internal referral to [facility].   An appointment has been scheduled for April 24 at 11:00 with Dr. Amalia Hailey.   I have called the patient and made her aware. She has the address and phone number as well.   Address: [address], [place], [place] [address] Phone: [phone]

## 2016-08-21 ENCOUNTER — Encounter: Payer: Self-pay | Admitting: Surgery

## 2016-08-21 ENCOUNTER — Encounter: Payer: BLUE CROSS/BLUE SHIELD | Admitting: Obstetrics and Gynecology

## 2016-09-11 ENCOUNTER — Encounter: Payer: BLUE CROSS/BLUE SHIELD | Admitting: Obstetrics and Gynecology

## 2018-04-20 IMAGING — CR DG ABDOMEN 2V
3 series · 3 of 3 positions shown · non-contrast
Comparison: Earlier same day ; 10/06/2015; 09/29/2015; CT abdomen
pelvis -04/28/2016

CLINICAL DATA: Abdominal pain. NG tube placement. History of hernia
repair and gastric sleeve resection.

EXAM:
ABDOMEN - 2 VIEW

[abdomen erect]
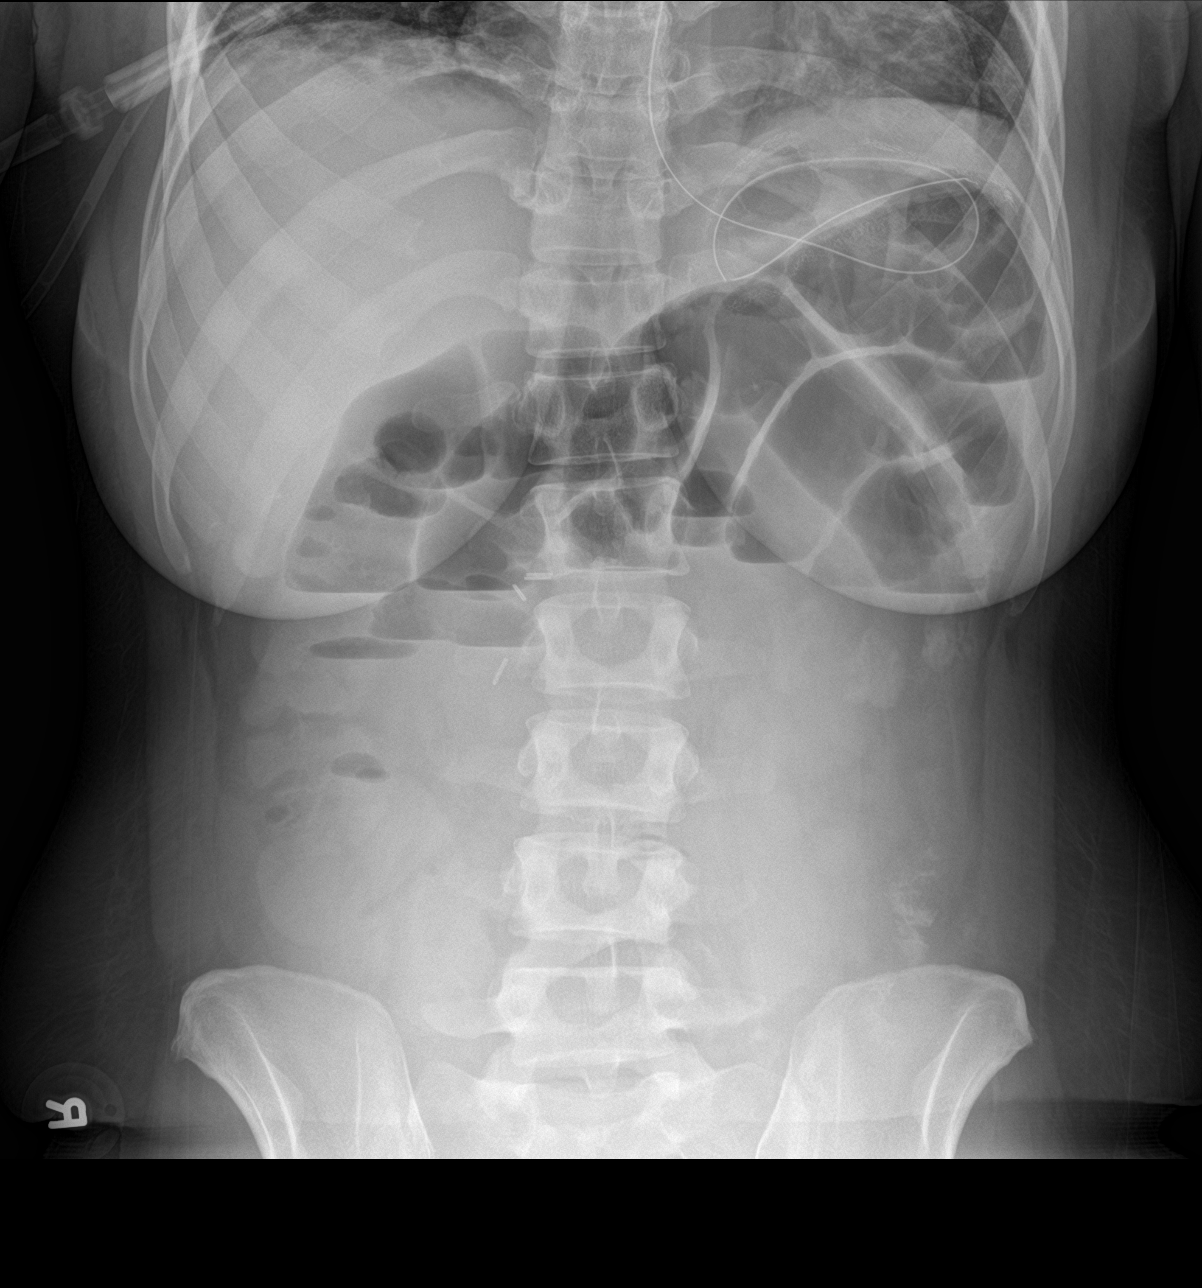

[abdomen supine (1 of 2)]
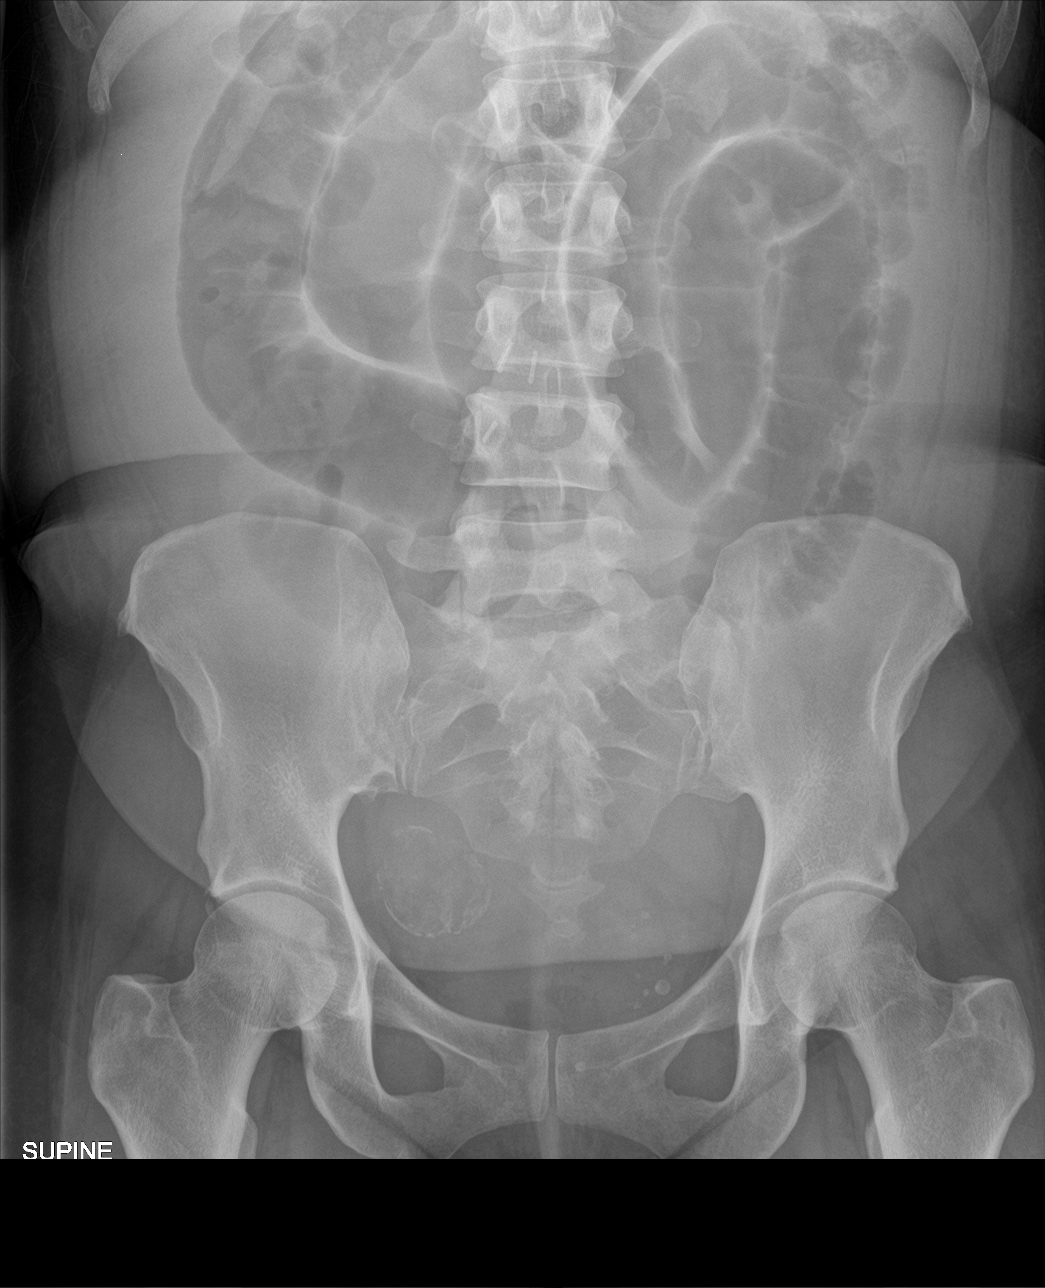

[abdomen supine (2 of 2)]
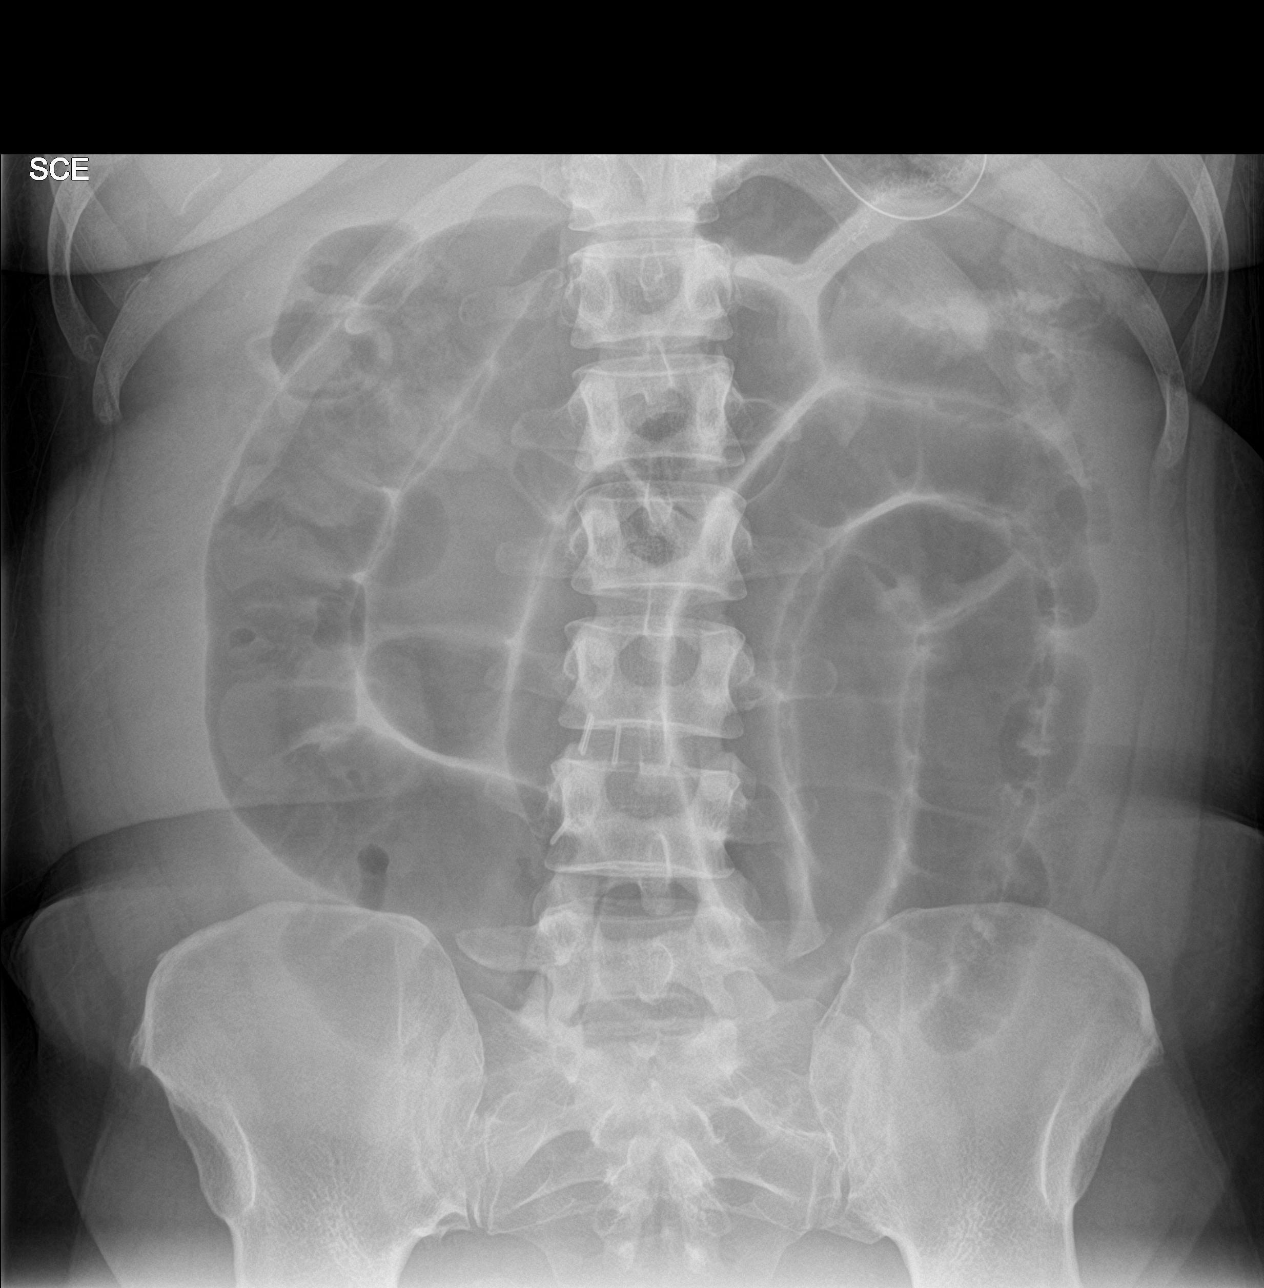

[3 of 3 positions shown; findings below may reference images not displayed]

FINDINGS: There is moderate to marked gas distention of multiple loops of
slightly patulous appearing small bowel with index loop measuring
approximately 4.4 cm in diameter. These findings are associated with
a conspicuous paucity of distal colonic gas.

No pneumoperitoneum, pneumatosis or portal venous gas.

Several phleboliths overlie the left hemipelvis. A partially
calcified fibroid measuring approximately 3.9 cm overlies the right
hemipelvis.

Multiple surgical clips overlie the midline of the abdomen. Enteric
staple lines overlie the left upper abdominal quadrant.

Unchanged positioning of enteric tube overlying expected location of
the gastric fundus.

Limited visualization of lower thorax demonstrates minimal right
basilar linear heterogeneous opacities favored to represent
atelectasis.

No acute osseus abnormalities.
IMPRESSION: 1. Similar findings worrisome for high-grade distal small-bowel
obstruction.
2. Enteric tube tip and side port project over the expected location
of the gastric fundus.

## 2018-07-25 IMAGING — CT CT ABD-PELV W/ CM
2 of 4 series · 16 of 46 positions shown, 18 images · IV contrast (APPLIED)
Comparison: 04/28/2016

CLINICAL DATA: Generalized abdominal pain and nausea for 3 days.
Nausea. Abdominal distention. Recent small bowel obstruction.

EXAM:
CT ABDOMEN AND PELVIS WITH CONTRAST
TECHNIQUE: Multidetector CT imaging of the abdomen and pelvis was performed
using the standard protocol following bolus administration of
intravenous contrast.
CONTRAST:  100mL [id number] IOPAMIDOL ([id number]) INJECTION 61%

[Series 2: routine abd/pel with · axial · 0.75mm/px · z∈[-1138,-738]mm · 13 of 88 slices shown, 15 images]
[im 4/88  soft-tissue]
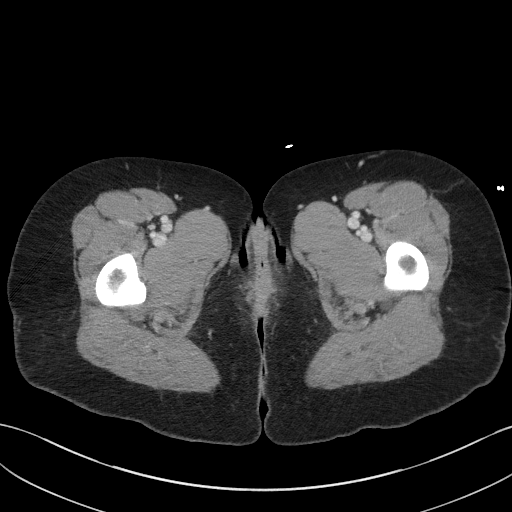
[im 4/88  bone]
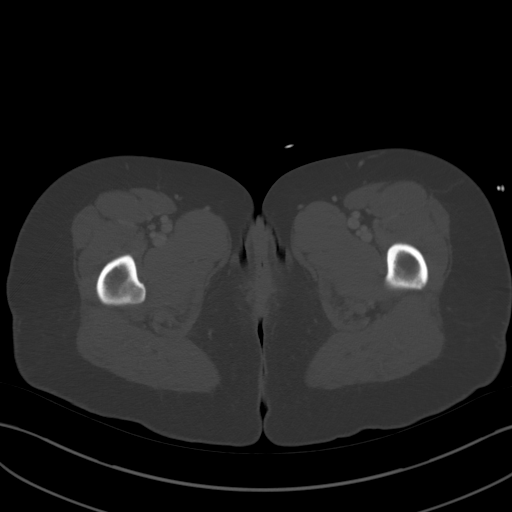
[im 11/88  soft-tissue]
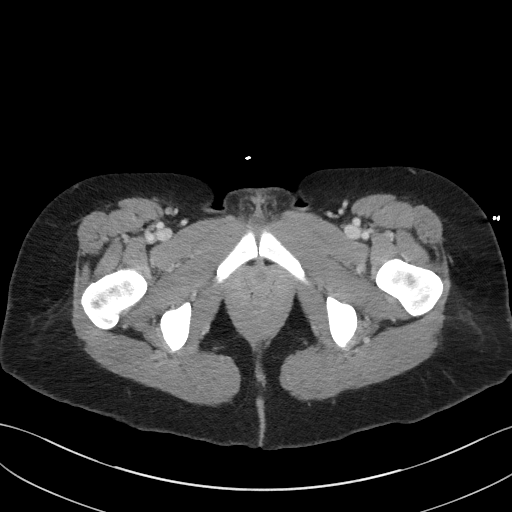
[im 18/88  soft-tissue]
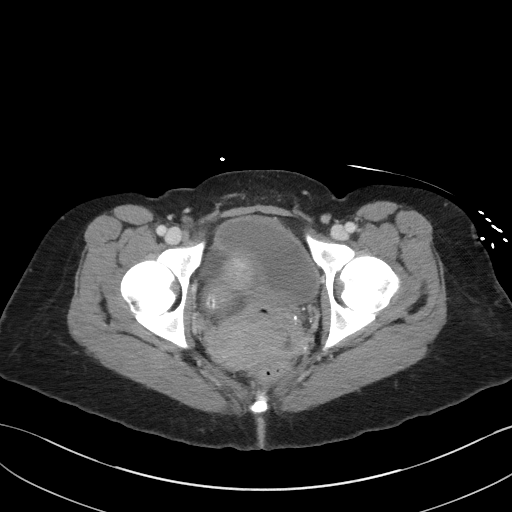
[im 25/88  soft-tissue]
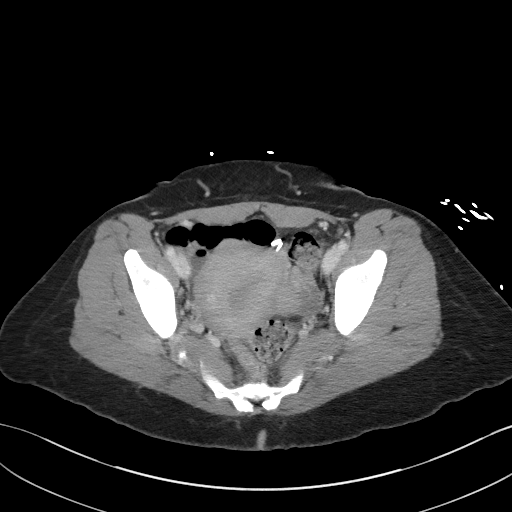
[im 32/88  soft-tissue]
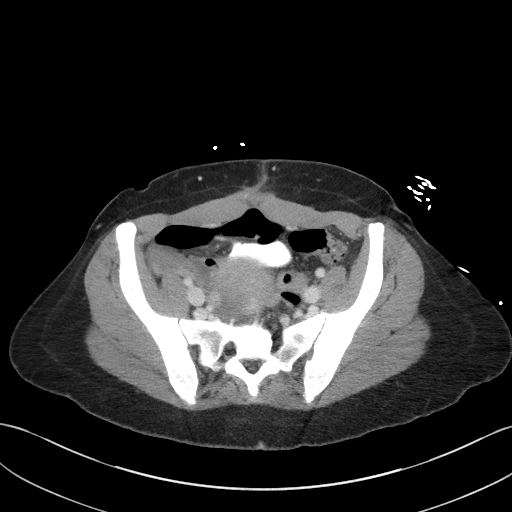
[im 39/88  soft-tissue]
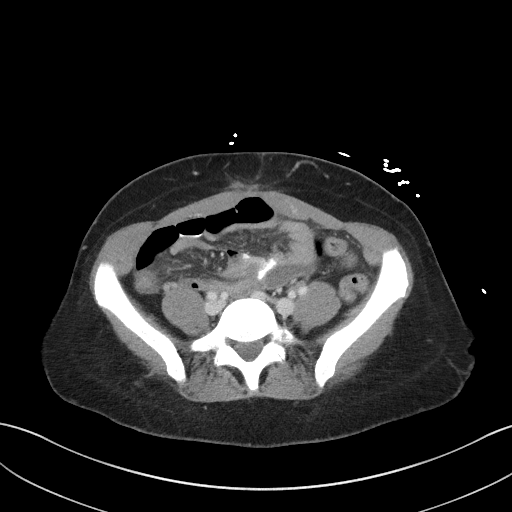
[im 46/88  soft-tissue]
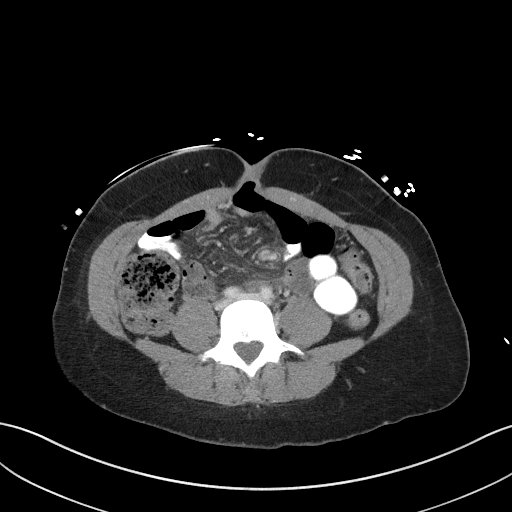
[im 49/88  soft-tissue]
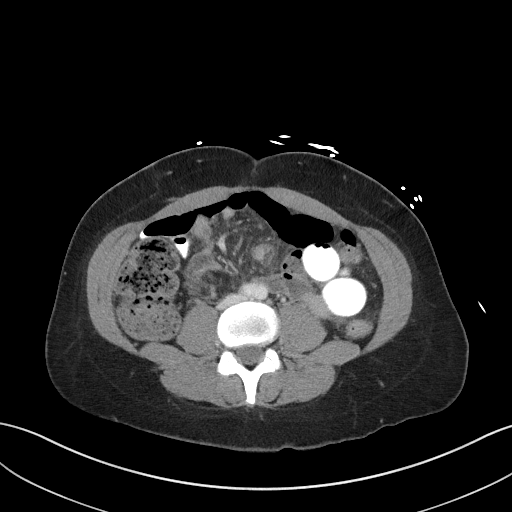
[im 56/88  soft-tissue]
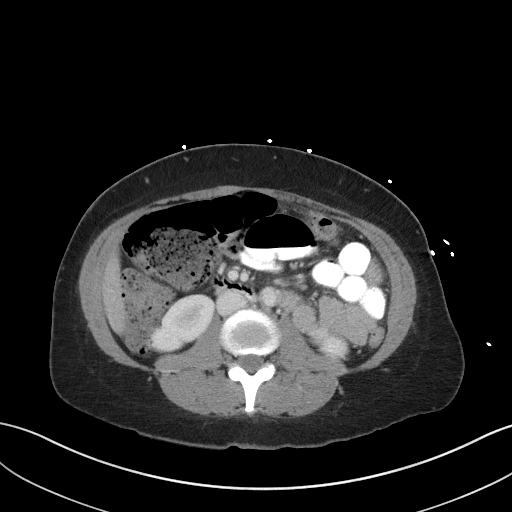
[im 56/88  bone]
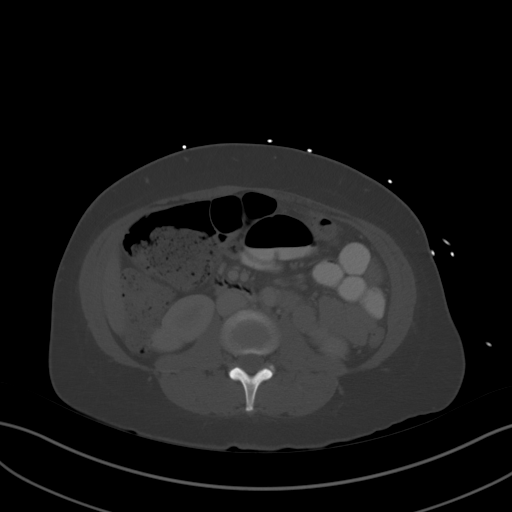
[im 63/88  soft-tissue]
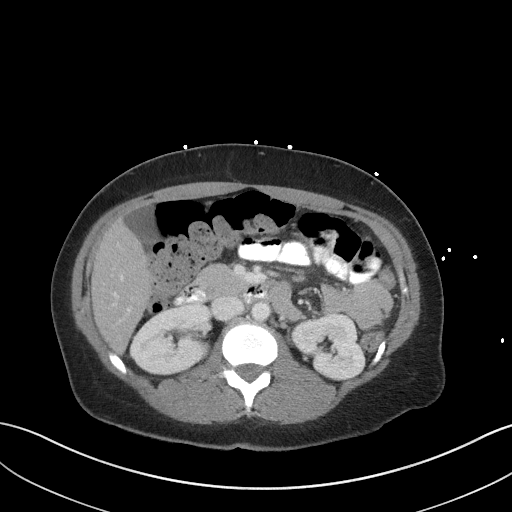
[im 70/88  soft-tissue]
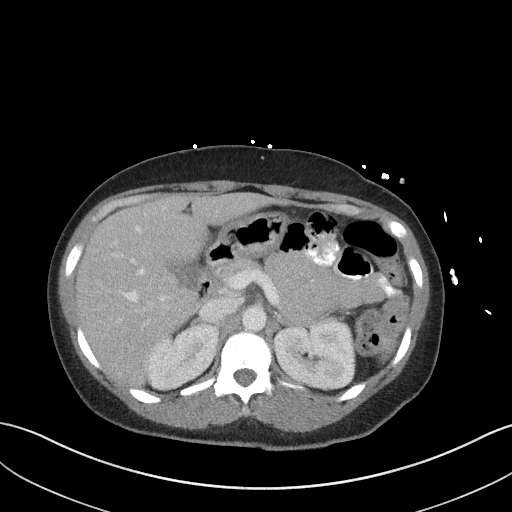
[im 77/88  soft-tissue]
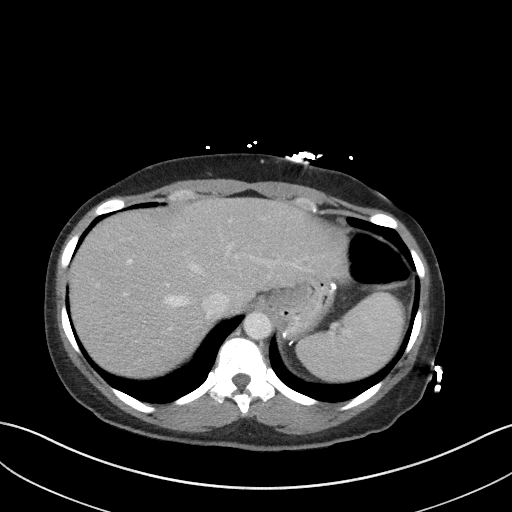
[im 84/88  soft-tissue]
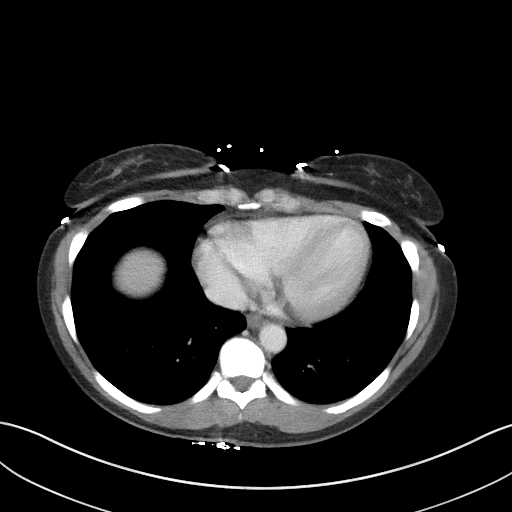

[Series 5: coronal st · coronal · 0.68mm/px · 3 of 69 slices shown]
[im 23/69  soft-tissue]
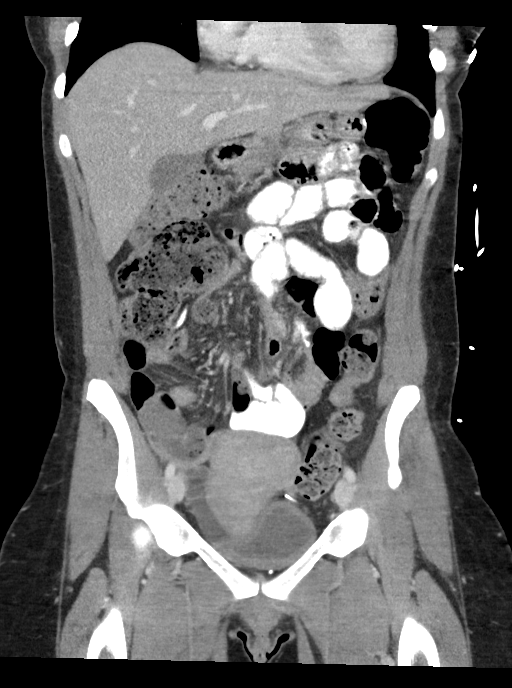
[im 31/69  soft-tissue]
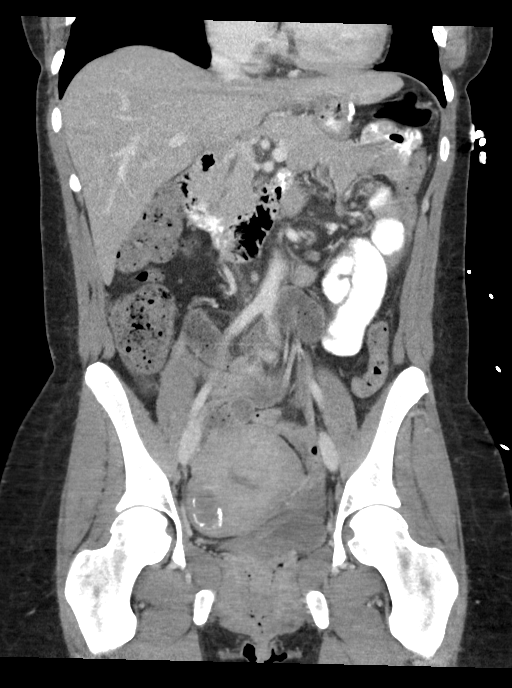
[im 38/69  soft-tissue]
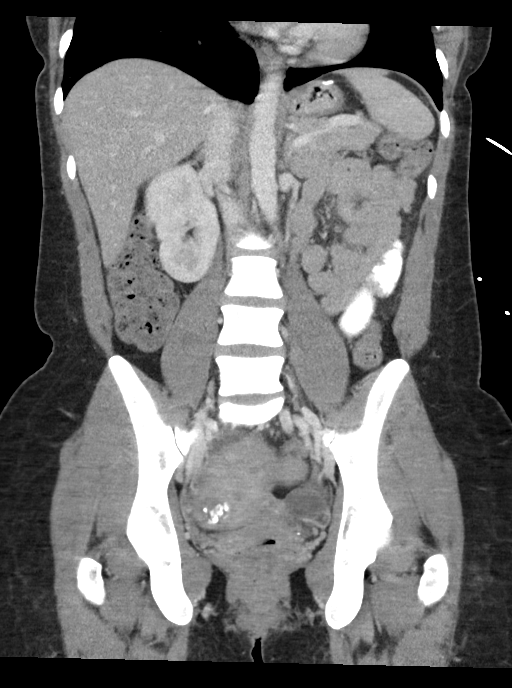

[16 of 46 positions shown; findings below may reference images not displayed]

FINDINGS: Lower Chest: No acute findings.

Hepatobiliary:  No masses identified. Gallbladder is unremarkable.

Pancreas:  No mass or inflammatory changes.

Spleen: Within normal limits in size and appearance.

Adrenals/Urinary Tract: No masses identified. No evidence of
hydronephrosis. Unremarkable unopacified urinary bladder. 1.0 cm
cystic focus in the region of the urethra is suspicious for a small
urethral diverticulum

Stomach/Bowel: Postop changes from prior sleeve gastrectomy again
seen. No evidence of bowel obstruction, inflammatory process or
abnormal fluid collections.

Vascular/Lymphatic: No pathologically enlarged lymph nodes. No
abdominal aortic aneurysm.

Reproductive: Multiple uterine fibroids are again seen, largest
showing central necrosis and peripheral calcification measuring
cm.

Other:  None.

Musculoskeletal:  No suspicious bone lesions identified.
IMPRESSION: No acute findings within the abdomen or pelvis.

Stable uterine fibroids measuring up to 3 cm, and probable 1 cm
urethral diverticulum.

## 2019-09-23 ENCOUNTER — Emergency Department
Admission: EM | Admit: 2019-09-23 | Discharge: 2019-09-23 | Disposition: A | Discharge status: Home / Self Care | Payer: BLUE CROSS/BLUE SHIELD | Attending: Emergency Medicine | Admitting: Emergency Medicine

## 2019-09-23 ENCOUNTER — Other Ambulatory Visit: Payer: Self-pay

## 2019-09-23 ENCOUNTER — Encounter: Payer: Self-pay | Admitting: Emergency Medicine

## 2019-09-23 DIAGNOSIS — F17210 Nicotine dependence, cigarettes, uncomplicated: Secondary | ICD-10-CM | POA: Insufficient documentation

## 2019-09-23 DIAGNOSIS — Z20822 Contact with and (suspected) exposure to covid-19: Secondary | ICD-10-CM | POA: Insufficient documentation

## 2019-09-23 DIAGNOSIS — D649 Anemia, unspecified: Secondary | ICD-10-CM | POA: Insufficient documentation

## 2019-09-23 DIAGNOSIS — Z79899 Other long term (current) drug therapy: Secondary | ICD-10-CM | POA: Insufficient documentation

## 2019-09-23 DIAGNOSIS — R42 Dizziness and giddiness: Secondary | ICD-10-CM | POA: Insufficient documentation

## 2019-09-23 DIAGNOSIS — R5383 Other fatigue: Secondary | ICD-10-CM | POA: Insufficient documentation

## 2019-09-23 LAB — DIFFERENTIAL
Abs Immature Granulocytes: 0.01 10*3/uL (ref 0.00–0.07)
Basophils Absolute: 0 10*3/uL (ref 0.0–0.1)
Basophils Relative: 1 %
Eosinophils Absolute: 0 10*3/uL (ref 0.0–0.5)
Eosinophils Relative: 1 %
Immature Granulocytes: 0 %
Lymphocytes Relative: 13 %
Lymphs Abs: 0.5 10*3/uL — ABNORMAL LOW (ref 0.7–4.0)
Monocytes Absolute: 0.5 10*3/uL (ref 0.1–1.0)
Monocytes Relative: 15 %
Neutro Abs: 2.5 10*3/uL (ref 1.7–7.7)
Neutrophils Relative %: 70 %
Smear Review: NORMAL

## 2019-09-23 LAB — PATHOLOGIST SMEAR REVIEW

## 2019-09-23 LAB — COMPREHENSIVE METABOLIC PANEL
ALT: 20 U/L (ref 0–44)
AST: 22 U/L (ref 15–41)
Albumin: 3.8 g/dL (ref 3.5–5.0)
Alkaline Phosphatase: 57 U/L (ref 38–126)
Anion gap: 11 (ref 5–15)
BUN: 9 mg/dL (ref 6–20)
CO2: 21 mmol/L — ABNORMAL LOW (ref 22–32)
Calcium: 8.9 mg/dL (ref 8.9–10.3)
Chloride: 102 mmol/L (ref 98–111)
Creatinine, Ser: 0.37 mg/dL — ABNORMAL LOW (ref 0.44–1.00)
GFR calc Af Amer: >60 mL/min (ref >60)
GFR calc non Af Amer: >60 mL/min (ref >60)
Glucose, Bld: 94 mg/dL (ref 70–99)
Potassium: 3.9 mmol/L (ref 3.5–5.1)
Sodium: 134 mmol/L — ABNORMAL LOW (ref 135–145)
Total Bilirubin: 0.7 mg/dL (ref 0.3–1.2)
Total Protein: 8 g/dL (ref 6.5–8.1)

## 2019-09-23 LAB — CBC
HCT: 18.8 % — ABNORMAL LOW (ref 36.0–46.0)
HCT: 18.3 % — ABNORMAL LOW (ref 36.0–46.0)
Hemoglobin: 4.7 g/dL — CL (ref 12.0–15.0)
Hemoglobin: 4.6 g/dL — CL (ref 12.0–15.0)
MCH: 16 pg — ABNORMAL LOW (ref 26.0–34.0)
MCH: 16 pg — ABNORMAL LOW (ref 26.0–34.0)
MCHC: 25 g/dL — ABNORMAL LOW (ref 30.0–36.0)
MCHC: 25.1 g/dL — ABNORMAL LOW (ref 30.0–36.0)
MCV: 63.9 fL — ABNORMAL LOW (ref 80.0–100.0)
MCV: 63.5 fL — ABNORMAL LOW (ref 80.0–100.0)
Platelets: 270 10*3/uL (ref 150–400)
Platelets: 258 10*3/uL (ref 150–400)
RBC: 2.94 MIL/uL — ABNORMAL LOW (ref 3.87–5.11)
RBC: 2.88 MIL/uL — ABNORMAL LOW (ref 3.87–5.11)
RDW: 20.8 % — ABNORMAL HIGH (ref 11.5–15.5)
RDW: 20.2 % — ABNORMAL HIGH (ref 11.5–15.5)
WBC: 3.4 10*3/uL — ABNORMAL LOW (ref 4.0–10.5)
WBC: 3.6 10*3/uL — ABNORMAL LOW (ref 4.0–10.5)
nRBC: 0 % (ref 0.0–0.2)
nRBC: 0 % (ref 0.0–0.2)

## 2019-09-23 LAB — SARS CORONAVIRUS 2 BY RT PCR (HOSPITAL ORDER, PERFORMED IN ~~LOC~~ HOSPITAL LAB): SARS Coronavirus 2: NEGATIVE

## 2019-09-23 LAB — IRON AND TIBC
Iron: 15 ug/dL — ABNORMAL LOW (ref 28–170)
Saturation Ratios: 3 % — ABNORMAL LOW (ref 10.4–31.8)
TIBC: 574 ug/dL — ABNORMAL HIGH (ref 250–450)
UIBC: 559 ug/dL

## 2019-09-23 LAB — FERRITIN: Ferritin: 3 ng/mL — ABNORMAL LOW (ref 11–307)

## 2019-09-23 LAB — ABO/RH: ABO/RH(D): B POS

## 2019-09-23 LAB — PREPARE RBC (CROSSMATCH)

## 2019-09-23 MED ORDER — SODIUM CHLORIDE 0.9 % IV SOLN
10.0000 mL/h | Freq: Once | INTRAVENOUS | Status: AC
  Administered 2019-09-23: 10 mL/h via INTRAVENOUS

## 2019-09-23 MED ORDER — FERROUS SULFATE 325 (65 FE) MG PO TABS: 325.0000 mg | ORAL_TABLET | Freq: Every day | ORAL | 0 refills | Status: DC

## 2019-09-23 NOTE — ED Notes (Addendum)
Patient ambulating to and from room commode with a steady gait. No dyspnea noted.

## 2019-09-23 NOTE — Progress Notes (Signed)
Spoke with Primary RN, second PIV not needed.

## 2019-09-23 NOTE — ED Triage Notes (Signed)
Pt reports her MD called her and told her to come to the ED because her hbg was 4.7.

## 2019-09-23 NOTE — ED Triage Notes (Signed)
Pt reports does have a hx of anemia but never that low

## 2019-09-23 NOTE — Discharge Instructions (Addendum)
Take iron pills as prescribed.  Please follow-up with Dr. Grayland Ormond in 2 days.  Return to ER if you have further bleeding and passing out and chest pain or shortness of breath.

## 2019-09-23 NOTE — ED Provider Notes (Signed)
[facility] Emergency Department Provider Note  ____________________________________________  Time seen: Approximately 3:36 PM  I have reviewed the triage vital signs and the nursing notes.   HISTORY  Chief Complaint Abnormal Lab    HPI Catherine Hunter is a 44 y.o. female with a history of anemia and asthma who comes ED complaining of shortness of breath and fatigue, dizziness with standing, gradually worsening for the past few weeks.  Denies black or bloody stool, no vomiting, no abdominal pain.  She does have heavy bleeding with her menses which is 5 days a month.  Denies intermittent bleeding.  No rash or petechiae.  No blood thinner use.  No history of bleeding disorder.      Past Medical History:  Diagnosis Date  . Anemia   . Asthma      Patient Active Problem List   Diagnosis Date Noted  . SBO (small bowel obstruction) ([place]) 04/28/2016  . Leukopenia 10/06/2015     Past Surgical History:  Procedure Laterality Date  . CESAREAN SECTION  1997  . ECTOPIC PREGNANCY SURGERY  2000   x2  . HERNIA REPAIR    . LAPAROSCOPIC GASTRIC SLEEVE RESECTION  07/20/2014  . LAPAROTOMY N/A 04/29/2016   Procedure: EXPLORATORY LAPAROTOMY and appendectomy;  Surgeon: Florene Glen, MD;  Location: [facility] ORS;  Service: General;  Laterality: N/A;     Prior to Admission medications   Medication Sig Start Date End Date Taking? Authorizing Provider  albuterol (VENTOLIN HFA) 108 (90 Base) MCG/ACT inhaler  09/18/19  Yes [provider]  cholecalciferol (VITAMIN D3) 25 MCG (1000 UNIT) tablet Take 1,000 Units by mouth daily.    [provider]  ferrous sulfate (FERROUSUL) 325 (65 FE) MG tablet Take 325 mg by mouth daily with breakfast.    [provider]  fluticasone (FLONASE) 50 MCG/ACT nasal spray fluticasone propionate 50 mcg/actuation nasal spray,suspension    [provider]  HYDROmorphone (DILAUDID) 2 MG tablet hydromorphone 2  mg tablet    [provider]  Multiple Vitamins-Minerals (MULTIVITAMIN WITH MINERALS) tablet Take 1 tablet by mouth daily.    [provider]  ondansetron (ZOFRAN-ODT) 4 MG disintegrating tablet ondansetron 4 mg disintegrating tablet    [provider]  phentermine (ADIPEX-P) 37.5 MG tablet phentermine 37.5 mg tablet    [provider]     Allergies Hydrocodone and Oxycodone   Family History  Problem Relation Age of Onset  . Hypertension Mother   . Hypertension Father   . Throat cancer Father   . Heart disease Father   . Hypertension Sister   . Hypertension Sister     Social History Social History   Tobacco Use  . Smoking status: Current Some Day Smoker    Packs/day: 0.25    Years: 4.00    Pack years: 1.00  . Smokeless tobacco: Never Used  Substance Use Topics  . Alcohol use: Yes    Comment: 2 drinks wine/ weekly  . Drug use: No    Review of Systems  Constitutional:   No fever or chills.  ENT:   No sore throat. No rhinorrhea. Cardiovascular:   No chest pain or syncope. Respiratory:   No dyspnea or cough. Gastrointestinal:   Negative for abdominal pain, vomiting and diarrhea.  Musculoskeletal:   Negative for focal pain or swelling All other systems reviewed and are negative except as documented above in ROS and HPI.  ____________________________________________   PHYSICAL EXAM:  VITAL SIGNS: ED Triage Vitals  Enc Vitals Group     BP 09/23/19 1010 133/70     Pulse Rate 09/23/19 1010 88     Resp 09/23/19 1010 18     Temp 09/23/19 1010 99.4 F (37.4 C)     Temp Source 09/23/19 1010 Oral     SpO2 09/23/19 1010 100 %     Weight 09/23/19 1004 140 lb (63.5 kg)     Height 09/23/19 1004 5\' 1"  (1.549 m)     Head Circumference --      Peak Flow --      Pain Score 09/23/19 1004 0     Pain Loc --      Pain Edu? --      Excl. in [place]? --     Vital signs reviewed, nursing assessments reviewed.   Constitutional:   Alert and  oriented. Non-toxic appearance. Eyes:   Conjunctivae are pale. EOMI. PERRL. ENT      Head:   Normocephalic and atraumatic.      Nose:   Wearing a mask.      Mouth/Throat:   Wearing a mask.      Neck:   No meningismus. Full ROM. Hematological/Lymphatic/Immunilogical:   No cervical lymphadenopathy. Cardiovascular:   RRR. Symmetric bilateral radial and DP pulses.  No murmurs. Cap refill less than 2 seconds. Respiratory:   Normal respiratory effort without tachypnea/retractions. Breath sounds are clear and equal bilaterally. No wheezes/rales/rhonchi. Gastrointestinal:   Soft and nontender. Non distended. There is no CVA tenderness.  No rebound, rigidity, or guarding. Musculoskeletal:   Normal range of motion in all extremities. No joint effusions.  No lower extremity tenderness.  No edema. Neurologic:   Normal speech and language.  Motor grossly intact. No acute focal neurologic deficits are appreciated.  Skin:    Skin is warm, dry and intact. No rash noted.  No petechiae, purpura, or bullae.  ____________________________________________    LABS (pertinent positives/negatives) (all labs ordered are listed, but only abnormal results are displayed) Labs Reviewed  COMPREHENSIVE METABOLIC PANEL - Abnormal; Notable for the following components:      Result Value   Sodium 134 (*)    CO2 21 (*)    Creatinine, Ser 0.37 (*)    All other components within normal limits  CBC - Abnormal; Notable for the following components:   WBC 3.4 (*)    RBC 2.94 (*)    Hemoglobin 4.7 (*)    HCT 18.8 (*)    MCV 63.9 (*)    MCH 16.0 (*)    MCHC 25.0 (*)    RDW 20.8 (*)    All other components within normal limits  DIFFERENTIAL - Abnormal; Notable for the following components:   Lymphs Abs 0.5 (*)    All other components within normal limits  IRON AND TIBC - Abnormal; Notable for the following components:   Iron 15 (*)    TIBC 574 (*)    Saturation Ratios 3 (*)    All other components within normal  limits  FERRITIN - Abnormal; Notable for the following components:   Ferritin 3 (*)    All other components within normal limits  CBC - Abnormal; Notable for the following components:   WBC 3.6 (*)    RBC 2.88 (*)    Hemoglobin 4.6 (*)    HCT 18.3 (*)    MCV 63.5 (*)    MCH 16.0 (*)    MCHC 25.1 (*)    RDW 20.2 (*)    All  other components within normal limits  SARS CORONAVIRUS 2 BY RT PCR (HOSPITAL ORDER, [facility])  PATHOLOGIST SMEAR REVIEW  TYPE AND SCREEN  ABO/RH  PREPARE RBC (CROSSMATCH)   ____________________________________________   EKG    ____________________________________________    RADIOLOGY  No results found.  ____________________________________________   PROCEDURES .Critical Care Performed by: Carrie Mew, MD Authorized by: Carrie Mew, MD   Critical care provider statement:    Critical care time (minutes):  35   Critical care time was exclusive of:  Separately billable procedures and treating other patients   Critical care was necessary to treat or prevent imminent or life-threatening deterioration of the following conditions:  Circulatory failure and shock   Critical care was time spent personally by me on the following activities:  Development of treatment plan with patient or surrogate, discussions with consultants, evaluation of patient's response to treatment, examination of patient, obtaining history from patient or surrogate, ordering and performing treatments and interventions, ordering and review of laboratory studies, ordering and review of radiographic studies, pulse oximetry, re-evaluation of patient's condition and review of old charts    ____________________________________________    CLINICAL IMPRESSION / [place] / ED COURSE  Medications ordered in the ED: Medications  0.9 %  sodium chloride infusion (10 mL/hr Intravenous New Bag/Given 09/23/19 1347)    Pertinent labs &  imaging results that were available during my care of the patient were reviewed by me and considered in my medical decision making (see chart for details).  Catherine Hunter was evaluated in Emergency Department on 09/23/2019 for the symptoms described in the history of present illness. She was evaluated in the context of the global COVID-19 pandemic, which necessitated consideration that the patient might be at risk for infection with the SARS-CoV-2 virus that causes COVID-19. Institutional protocols and algorithms that pertain to the evaluation of patients at risk for COVID-19 are in a state of rapid change based on information released by regulatory bodies including the CDC and federal and state organizations. These policies and algorithms were followed during the patient's care in the ED.   Patient presents with acute on chronic anemia, symptomatic, most likely due to menorrhagia from heavy periods each month.  No other symptoms to suggest other bleeding source, doubt hemolysis.  We will add on iron panel, discuss with hematology  Clinical Course as of Sep 23 1534  Wed Sep 23, 2019  1255 D/w Dr. Grayland Ormond - agrees with transfuse 2 units rbcs, after which pt would likely be suitable for outpatient follow up given relatively healthy status. He will facilitate close f/u in 1-2 days.    [PS]    Clinical Course User Index [PS] Carrie Mew, MD     ____________________________________________   FINAL CLINICAL IMPRESSION(S) / ED DIAGNOSES    Final diagnoses:  Symptomatic anemia     ED Discharge Orders    None      Portions of this note were generated with dragon dictation software. Dictation errors may occur despite best attempts at proofreading.   Carrie Mew, MD 09/23/19 1537

## 2019-09-23 NOTE — ED Notes (Signed)
Patient tolerated blood transfusion well. Patient is comfortable, watching TV.

## 2019-09-23 NOTE — ED Notes (Signed)
Patient is tolerating blood transfusion well.

## 2019-09-23 NOTE — ED Provider Notes (Signed)
  Physical Exam  BP (!) 147/90   Pulse 85   Temp 99.1 F (37.3 C) (Oral)   Resp 16   Ht 5\' 2"  (1.575 m)   Wt 72.6 kg   LMP 08/26/2019   SpO2 100%   BMI 29.26 kg/m   Physical Exam  ED Course/Procedures   Clinical Course as of Sep 23 1819  Wed Sep 23, 2019  1255 D/w Dr. Grayland Ormond - agrees with transfuse 2 units rbcs, after which pt would likely be suitable for outpatient follow up given relatively healthy status. He will facilitate close f/u in 1-2 days.    [PS]    Clinical Course User Index [PS] Carrie Mew, MD    Procedures  MDM  Care assumed at 3 PM.  Patient has history anemia and baseline hemoglobin is around 7 but she has menorrhagia and hemoglobin today is 4.6.  Patient has no active bleeding right now.  Previous provider discussed with Dr. Grayland Ormond who recommend 2 units PRBC and start on iron pills and outpatient follow-up.  6:21 PM Transfusion finished about 530.  Patient observed for about an hour.  Patient has no obvious transfusion reactions.  Stable for discharge on iron pills.     Drenda Freeze, MD 09/23/19 Vernelle [place]ald

## 2019-09-23 NOTE — ED Notes (Signed)
Rainbow sent to the lab.  

## 2019-09-24 LAB — BPAM RBC
Blood Product Expiration Date: 202106162359
Blood Product Expiration Date: 202106192359
ISSUE DATE / TIME: 202105261323
ISSUE DATE / TIME: 202105261545
Unit Type and Rh: 7300
Unit Type and Rh: 7300

## 2019-09-24 LAB — TYPE AND SCREEN
ABO/RH(D): B POS
Antibody Screen: NEGATIVE
Unit division: 0
Unit division: 0

## 2019-10-01 ENCOUNTER — Other Ambulatory Visit: Payer: Self-pay

## 2019-10-01 ENCOUNTER — Inpatient Hospital Stay: Payer: BC Managed Care – PPO

## 2019-10-01 ENCOUNTER — Inpatient Hospital Stay: Payer: BC Managed Care – PPO | Attending: Oncology | Admitting: Oncology

## 2019-10-01 VITALS — BP 149/85 | HR 81 | Temp 97.4°F | Wt 157.8 lb

## 2019-10-01 DIAGNOSIS — F17210 Nicotine dependence, cigarettes, uncomplicated: Secondary | ICD-10-CM | POA: Diagnosis not present

## 2019-10-01 DIAGNOSIS — N920 Excessive and frequent menstruation with regular cycle: Secondary | ICD-10-CM | POA: Diagnosis not present

## 2019-10-01 DIAGNOSIS — D509 Iron deficiency anemia, unspecified: Secondary | ICD-10-CM

## 2019-10-01 LAB — CBC WITH DIFFERENTIAL/PLATELET
Abs Immature Granulocytes: 0.02 10*3/uL (ref 0.00–0.07)
Basophils Absolute: 0 10*3/uL (ref 0.0–0.1)
Basophils Relative: 1 %
Eosinophils Absolute: 0.1 10*3/uL (ref 0.0–0.5)
Eosinophils Relative: 2 %
HCT: 25.1 % — ABNORMAL LOW (ref 36.0–46.0)
Hemoglobin: 7.2 g/dL — ABNORMAL LOW (ref 12.0–15.0)
Immature Granulocytes: 1 %
Lymphocytes Relative: 19 %
Lymphs Abs: 0.7 10*3/uL (ref 0.7–4.0)
MCH: 19.8 pg — ABNORMAL LOW (ref 26.0–34.0)
MCHC: 28.7 g/dL — ABNORMAL LOW (ref 30.0–36.0)
MCV: 69.1 fL — ABNORMAL LOW (ref 80.0–100.0)
Monocytes Absolute: 0.5 10*3/uL (ref 0.1–1.0)
Monocytes Relative: 12 %
Neutro Abs: 2.5 10*3/uL (ref 1.7–7.7)
Neutrophils Relative %: 65 %
Platelets: 269 10*3/uL (ref 150–400)
RBC: 3.63 MIL/uL — ABNORMAL LOW (ref 3.87–5.11)
RDW: 25.8 % — ABNORMAL HIGH (ref 11.5–15.5)
WBC: 3.8 10*3/uL — ABNORMAL LOW (ref 4.0–10.5)
nRBC: 0 % (ref 0.0–0.2)

## 2019-10-01 LAB — SAMPLE TO BLOOD BANK

## 2019-10-01 LAB — IRON AND TIBC
Iron: 19 ug/dL — ABNORMAL LOW (ref 28–170)
Saturation Ratios: 4 % — ABNORMAL LOW (ref 10.4–31.8)
TIBC: 525 ug/dL — ABNORMAL HIGH (ref 250–450)
UIBC: 506 ug/dL

## 2019-10-01 LAB — FERRITIN: Ferritin: 6 ng/mL — ABNORMAL LOW (ref 11–307)

## 2019-10-01 NOTE — Progress Notes (Signed)
Patient states since her last infusion, she has had very heavy menstrual cycle and feels very weak. She denies any pain or other concerns at this time.

## 2019-10-02 ENCOUNTER — Telehealth: Payer: Self-pay | Admitting: Emergency Medicine

## 2019-10-02 ENCOUNTER — Inpatient Hospital Stay: Payer: BC Managed Care – PPO

## 2019-10-02 NOTE — Telephone Encounter (Signed)
We can do intermittent FMLA for the time she is here.

## 2019-10-02 NOTE — Telephone Encounter (Signed)
Called patient after receiving secure chat that patient was cancelling appt for iron infusion for today and wanted to speak with MD or RN. Pt stated that she couldn't get off work today and needed to know if appts could be later in the day. Explained that due to the chair times, it was unlikely that appts could be any later than they were currently scheduled. Pt stated that she wouldn't be able to get any time off without getting FMLA, and would like to know if Dr. Grayland Ormond would fill out paperwork for FMLA? Will keep appt currently scheduled for 6/11, please advise about FMLA paperwork.

## 2019-10-02 NOTE — Telephone Encounter (Signed)
Called patient to let her know that we would fill out paperwork for her to get intermittent FMLA while getting iron infusions. Pt stated that she would fax it to [place]. Fax number provided to patient. Pt verbalized understanding and didn't have any further questions or concerns.

## 2019-10-02 NOTE — Progress Notes (Signed)
Catherine Hunter  Telephone:([phone] [phone]  ID: Kennyth Arnold OB: 03-07-1976  MR#: [id number]  YQI#:[id number]  Patient Care Team: Patient, No Pcp Per as PCP - General (General Practice) Lloyd Huger, MD as Consulting Physician (Oncology)  CHIEF COMPLAINT: Iron deficiency anemia.  INTERVAL HISTORY: Patient is a 44 year old female who initially presented to the emergency room with significant weakness and fatigue.  She is found to have a severe iron deficiency anemia with a hemoglobin of 4.6.  She subsequently received 2 units of packed red blood cells.  She continues to have weakness and fatigue, but states this has improved since being transfused.  She continues to have heavy menses.  She has no neurologic complaints.  She denies any recent fevers or illnesses.  She has a good appetite and denies weight loss.  She has no chest pain, shortness of breath, cough offices.  She denies any nausea, vomiting, constipation, or diarrhea.  She has no melena or night daily.  She has no urinary complaints.  Patient otherwise feels well and offers no further specific complaints today.  REVIEW OF SYSTEMS:   Review of Systems  Constitutional: Positive for malaise/fatigue. Negative for fever and weight loss.  Respiratory: Negative.  Negative for cough, hemoptysis and shortness of breath.   Cardiovascular: Negative.  Negative for chest pain and leg swelling.  Gastrointestinal: Negative.  Negative for abdominal pain, blood in stool and melena.  Genitourinary: Negative.  Negative for hematuria.  Musculoskeletal: Negative.  Negative for back pain.  Skin: Negative.  Negative for rash.  Neurological: Positive for weakness. Negative for dizziness, focal weakness and headaches.  Psychiatric/Behavioral: Negative.  The patient is not nervous/anxious.     As per HPI. Otherwise, a complete review of systems is negative.  PAST MEDICAL HISTORY: Past Medical History:    Diagnosis Date  . Anemia   . Asthma     PAST SURGICAL HISTORY: Past Surgical History:  Procedure Laterality Date  . CESAREAN SECTION  1997  . ECTOPIC PREGNANCY SURGERY  2000   x2  . HERNIA REPAIR    . LAPAROSCOPIC GASTRIC SLEEVE RESECTION  07/20/2014  . LAPAROTOMY N/A 04/29/2016   Procedure: EXPLORATORY LAPAROTOMY and appendectomy;  Surgeon: Florene Glen, MD;  Location: [facility] ORS;  Service: General;  Laterality: N/A;    FAMILY HISTORY: Family History  Problem Relation Age of Onset  . Hypertension Mother   . Hypertension Father   . Throat cancer Father   . Heart disease Father   . Hypertension Sister   . Hypertension Sister     ADVANCED DIRECTIVES (Y/N):  N  HEALTH MAINTENANCE: Social History   Tobacco Use  . Smoking status: Current Some Day Smoker    Packs/day: 0.25    Years: 4.00    Pack years: 1.00  . Smokeless tobacco: Never Used  Substance Use Topics  . Alcohol use: Yes    Comment: 2 drinks wine/ weekly  . Drug use: No     Colonoscopy:  PAP:  Bone density:  Lipid panel:  Allergies  Allergen Reactions  . Hydrocodone Hives and Itching  . Oxycodone Hives and Itching    Current Outpatient Medications  Medication Sig Dispense Refill  . albuterol (VENTOLIN HFA) 108 (90 Base) MCG/ACT inhaler Inhale 1 puff into the lungs as needed.     . ferrous sulfate (FERROUSUL) 325 (65 FE) MG tablet Take 1 tablet (325 mg total) by mouth daily with breakfast. 30 tablet 0  . Multiple Vitamins-Minerals (MULTIVITAMIN  WITH MINERALS) tablet Take 1 tablet by mouth daily.    . cholecalciferol (VITAMIN D3) 25 MCG (1000 UNIT) tablet Take 1,000 Units by mouth daily.     No current facility-administered medications for this visit.    OBJECTIVE: Vitals:   10/01/19 1518  BP: (!) 149/85  Pulse: 81  Temp: (!) 97.4 F (36.3 C)  SpO2: 100%     Body mass index is 28.86 kg/m.    ECOG FS:1 - Symptomatic but completely ambulatory  General: Well-developed, well-nourished, no  acute distress. Eyes: Pink conjunctiva, anicteric sclera. HEENT: Normocephalic, moist mucous membranes. Lungs: No audible wheezing or coughing. Heart: Regular rate and rhythm. Abdomen: Soft, nontender, no obvious distention. Musculoskeletal: No edema, cyanosis, or clubbing. Neuro: Alert, answering all questions appropriately. Cranial nerves grossly intact. Skin: No rashes or petechiae noted. Psych: Normal affect. Lymphatics: No cervical, calvicular, axillary or inguinal LAD.   LAB RESULTS:  Lab Results  Component Value Date   NA 134 (L) 09/23/2019   K 3.9 09/23/2019   CL 102 09/23/2019   CO2 21 (L) 09/23/2019   GLUCOSE 94 09/23/2019   BUN 9 09/23/2019   CREATININE 0.37 (L) 09/23/2019   CALCIUM 8.9 09/23/2019   PROT 8.0 09/23/2019   ALBUMIN 3.8 09/23/2019   AST 22 09/23/2019   ALT 20 09/23/2019   ALKPHOS 57 09/23/2019   BILITOT 0.7 09/23/2019   GFRNONAA >60 09/23/2019   GFRAA >60 09/23/2019    Lab Results  Component Value Date   WBC 3.8 (L) 10/01/2019   NEUTROABS 2.5 10/01/2019   HGB 7.2 (L) 10/01/2019   HCT 25.1 (L) 10/01/2019   MCV 69.1 (L) 10/01/2019   PLT 269 10/01/2019   Lab Results  Component Value Date   IRON 19 (L) 10/01/2019   TIBC 525 (H) 10/01/2019   IRONPCTSAT 4 (L) 10/01/2019   Lab Results  Component Value Date   FERRITIN 6 (L) 10/01/2019     STUDIES: No results found.  ASSESSMENT: Iron deficiency anemia.  PLAN:    1.  Iron deficiency anemia: Likely secondary to heavy menses.  Patient's hemoglobin remains significantly decreased, but improved to 7.2.  Iron stores also remain significantly decreased.  She does not require transfusion at this time, but will return to clinic tomorrow to receive 510 mg IV Feraheme.  Patient will then return to clinic in 1 and 2 weeks for additional IV Feraheme.  Return to clinic in 6 weeks with repeat laboratory work, further evaluation, and continuation of treatment if necessary. 2.  Heavy menses: Patient was  given a referral to OB/GYN for further evaluation.  I spent a total of 45 minutes reviewing chart data, face-to-face evaluation with the patient, counseling and coordination of care as detailed above.   Patient expressed understanding and was in agreement with this plan. She also understands that She can call clinic at any time with any questions, concerns, or complaints.    Lloyd Huger, MD   10/02/2019 7:01 AM

## 2019-10-06 ENCOUNTER — Telehealth: Payer: Self-pay | Admitting: Obstetrics & Gynecology

## 2019-10-06 NOTE — Telephone Encounter (Signed)
[place] at [facility] referring for Iron def. Anemia; menorrhagia. Called and left voicemail for patient to call back to be scheduled.

## 2019-10-09 ENCOUNTER — Other Ambulatory Visit: Payer: Self-pay

## 2019-10-09 ENCOUNTER — Inpatient Hospital Stay: Payer: BC Managed Care – PPO

## 2019-10-09 VITALS — BP 149/81 | HR 66 | Temp 96.6°F | Resp 18

## 2019-10-09 DIAGNOSIS — D509 Iron deficiency anemia, unspecified: Secondary | ICD-10-CM

## 2019-10-09 MED ORDER — SODIUM CHLORIDE 0.9 % IV SOLN
Freq: Once | INTRAVENOUS | Status: AC
  Filled 2019-10-09: qty 250

## 2019-10-09 MED ORDER — SODIUM CHLORIDE 0.9 % IV SOLN
510.0000 mg | Freq: Once | INTRAVENOUS | Status: AC
  Administered 2019-10-09: 510 mg via INTRAVENOUS
  Filled 2019-10-09: qty 510

## 2019-10-09 NOTE — Progress Notes (Signed)
Pt tolerated infusion well. No s/s of distress or reaction noted. Pt and VS stable at time of discharge.

## 2019-10-16 ENCOUNTER — Other Ambulatory Visit: Payer: Self-pay

## 2019-10-16 ENCOUNTER — Inpatient Hospital Stay: Payer: BC Managed Care – PPO

## 2019-10-16 VITALS — BP 134/82 | HR 77 | Temp 98.3°F | Resp 18

## 2019-10-16 DIAGNOSIS — D509 Iron deficiency anemia, unspecified: Secondary | ICD-10-CM

## 2019-10-16 MED ORDER — SODIUM CHLORIDE 0.9 % IV SOLN
Freq: Once | INTRAVENOUS | Status: AC
  Filled 2019-10-16: qty 250

## 2019-10-16 MED ORDER — SODIUM CHLORIDE 0.9 % IV SOLN
510.0000 mg | Freq: Once | INTRAVENOUS | Status: AC
  Administered 2019-10-16: 510 mg via INTRAVENOUS
  Filled 2019-10-16: qty 510

## 2019-10-22 ENCOUNTER — Inpatient Hospital Stay: Payer: BC Managed Care – PPO

## 2019-10-29 ENCOUNTER — Ambulatory Visit (INDEPENDENT_AMBULATORY_CARE_PROVIDER_SITE_OTHER): Payer: BC Managed Care – PPO | Admitting: Obstetrics and Gynecology

## 2019-10-29 ENCOUNTER — Other Ambulatory Visit: Payer: Self-pay

## 2019-10-29 ENCOUNTER — Encounter: Payer: Self-pay | Admitting: Obstetrics and Gynecology

## 2019-10-29 VITALS — BP 146/76 | HR 89 | Ht 61.5 in | Wt 155.0 lb

## 2019-10-29 DIAGNOSIS — N920 Excessive and frequent menstruation with regular cycle: Secondary | ICD-10-CM | POA: Diagnosis not present

## 2019-10-29 DIAGNOSIS — D259 Leiomyoma of uterus, unspecified: Secondary | ICD-10-CM | POA: Diagnosis not present

## 2019-10-29 DIAGNOSIS — D500 Iron deficiency anemia secondary to blood loss (chronic): Secondary | ICD-10-CM

## 2019-10-29 NOTE — Progress Notes (Signed)
Obstetrics & Gynecology Office Visit    Chief Complaint  Patient presents with  . Menorrhagia    referred by [place] [facility]   The patient is seen in referral at the request of Lloyd Huger, MD from [facility] for heavy menstrual bleeding and resultant severe iron deficiency anemia.   History of Present Illness: 44 y.o. [id number] female who is seen in referral from Lloyd Huger, MD from [facility] for heavy menstrual bleeding and resultant severe iron deficiency anemia.  In May of this year she presented to the ER with a hemoglobin of 4.6 after initially presenting with significant weakness and fatigue.  She received 2 units pRBCs.  She has been receiving iron infusions at the cancer center. She has had two iron infusions through [facility] and is due for another one tomorrow.  She doesn't feel a big improvement in her symptoms.   After her ER visit in May, she started her cycle shortly after.  Her menses started 4 days ago and she again lost a lot of blood. She has been having heavy periods for years. She believes that they have become heavier more recently, especially the past four months.  She believes she has only received a blood transfusion once for this. She did receive a blood transfusion after her son was born 45 years ago.  She has received iron infusions in the past. The only imaging she has had is a CT scan in 2018.  She doesn't recall the last time she had a pap smear. She believes it was probably normal.  She has received no medication to lessen the bleeding. She gets a period about every 28 days, lasting five days.  Day 4-5 the bleeding is usually not as bad.   She uses nothing for contraception.  She has had two tubal pregnancies.  She thinks her chances are probably low she could get pregnant.  She denies weight changes, constipation, bloating, early satiety.   Past Medical History:  Diagnosis Date  . Anemia   . Asthma     Past Surgical History:    Procedure Laterality Date  . CESAREAN SECTION  1997  . ECTOPIC PREGNANCY SURGERY  2000   x2  . HERNIA REPAIR    . LAPAROSCOPIC GASTRIC SLEEVE RESECTION  07/20/2014  . LAPAROTOMY N/A 04/29/2016   Procedure: EXPLORATORY LAPAROTOMY and appendectomy;  Surgeon: Florene Glen, MD;  Location: [facility] ORS;  Service: General;  Laterality: N/A;    Gynecologic History: Patient's last menstrual period was 10/25/2019 (exact date).  Obstetric History: [id number], s/p SAB x 1, EAB x 1, ectopic x 2.   Family History  Problem Relation Age of Onset  . Hypertension Mother   . Hypertension Father   . Throat cancer Father   . Heart disease Father   . Hypertension Sister   . Hypertension Sister   . Uterine cancer Neg Hx   . Ovarian cancer Neg Hx     Social History   Socioeconomic History  . Marital status: Married    Spouse name: Not on file  . Number of children: Not on file  . Years of education: Not on file  . Highest education level: Not on file  Occupational History  . Not on file  Tobacco Use  . Smoking status: Current Some Day Smoker    Packs/day: 0.25    Years: 4.00    Pack years: 1.00    Types: Cigars  . Smokeless tobacco: Never Used  Vaping Use  . Vaping Use: Never used  Substance and Sexual Activity  . Alcohol use: Yes    Comment: 2 drinks wine/ weekly  . Drug use: No  . Sexual activity: Yes    Birth control/protection: Abstinence  Other Topics Concern  . Not on file  Social History Narrative  . Not on file   Social Determinants of Health   Financial Resource Strain:   . Difficulty of Paying Living Expenses:   Food Insecurity:   . Worried About Charity fundraiser in the Last Year:   . Arboriculturist in the Last Year:   Transportation Needs:   . Film/video editor (Medical):   Marland Kitchen Lack of Transportation (Non-Medical):   Physical Activity:   . Days of Exercise per Week:   . Minutes of Exercise per Session:   Stress:   . Feeling of Stress :   Social  Connections:   . Frequency of Communication with Friends and Family:   . Frequency of Social Gatherings with Friends and Family:   . Attends Religious Services:   . Active Member of Clubs or Organizations:   . Attends Archivist Meetings:   Marland Kitchen Marital Status:   Intimate Partner Violence:   . Fear of Current or Ex-Partner:   . Emotionally Abused:   Marland Kitchen Physically Abused:   . Sexually Abused:     Allergies  Allergen Reactions  . Hydrocodone Hives and Itching  . Oxycodone Hives and Itching    Prior to Admission medications   Medication Sig Start Date End Date Taking? Authorizing Provider  albuterol (VENTOLIN HFA) 108 (90 Base) MCG/ACT inhaler Inhale 1 puff into the lungs as needed.  09/18/19  Yes [provider]  ferrous sulfate (FERROUSUL) 325 (65 FE) MG tablet Take 1 tablet (325 mg total) by mouth daily with breakfast. 09/23/19  Yes Drenda Freeze, MD  Multiple Vitamins-Minerals (MULTIVITAMIN WITH MINERALS) tablet Take 1 tablet by mouth daily.   Yes [provider]    Review of Systems  Constitutional: Negative.   HENT: Negative.   Eyes: Negative.   Respiratory: Negative.   Cardiovascular: Negative.   Gastrointestinal: Negative.   Genitourinary: Negative.   Musculoskeletal: Negative.   Skin: Negative.   Neurological: Negative.   Psychiatric/Behavioral: Negative.      Physical Exam BP (!) 146/76   Pulse 89   Ht 5' 1.5" (1.562 m)   Wt 155 lb (70.3 kg)   LMP 10/25/2019 (Exact Date)   BMI 28.81 kg/m  Patient's last menstrual period was 10/25/2019 (exact date). Physical Exam Constitutional:      General: She is not in acute distress.    Appearance: Normal appearance. She is well-developed.  HENT:     Head: Normocephalic and atraumatic.  Eyes:     General: No scleral icterus.    Conjunctiva/sclera: Conjunctivae normal.  Cardiovascular:     Rate and Rhythm: Normal rate and regular rhythm.     Heart sounds: No murmur heard.  No friction  rub. No gallop.   Pulmonary:     Effort: Pulmonary effort is normal. No respiratory distress.     Breath sounds: Normal breath sounds. No wheezing or rales.  Abdominal:     General: Bowel sounds are normal. There is no distension.     Palpations: Abdomen is soft. There is mass (likely uterus/fibroids extending into lower abdomen).     Tenderness: There is no abdominal tenderness. There is no guarding or rebound.  Musculoskeletal:  General: Normal range of motion.     Cervical back: Normal range of motion and neck supple.  Neurological:     General: No focal deficit present.     Mental Status: She is alert and oriented to person, place, and time.     Cranial Nerves: No cranial nerve deficit.  Skin:    General: Skin is warm and dry.     Findings: No erythema.  Psychiatric:        Mood and Affect: Mood normal.        Behavior: Behavior normal.        Judgment: Judgment normal.     Female chaperone present for pelvic and breast  portions of the physical exam  Assessment: 44 y.o. [id number] female here for  1. Menorrhagia with regular cycle   2. Iron deficiency anemia due to chronic blood loss   3. Uterine leiomyoma, unspecified location      Plan: Problem List Items Addressed This Visit      Other   Iron deficiency anemia   Relevant Orders   US PELVIS (TRANSABDOMINAL ONLY)   US PELVIS TRANSVAGINAL NON-OB (TV ONLY)    Other Visit Diagnoses    Menorrhagia with regular cycle    -  Primary   Relevant Orders   US PELVIS (TRANSABDOMINAL ONLY)   US PELVIS TRANSVAGINAL NON-OB (TV ONLY)   Uterine leiomyoma, unspecified location       Relevant Orders   US PELVIS (TRANSABDOMINAL ONLY)   US PELVIS TRANSVAGINAL NON-OB (TV ONLY)     Pelvic u/s ordered Defers pelvic exam today Will perform next visit with pap and endometrial biopsy  Discussed management options for abnormal uterine bleeding including expectant, NSAIDs, tranexamic acid (Lysteda), oral progesterone (Provera,  norethindrone, megace), Depo Provera, Levonorgestrel containing IUD, endometrial ablation (Novasure) or hysterectomy as definitive surgical management.  Discussed risks and benefits of each method.   Final management decision will hinge on results of patient's work up and whether an underlying etiology for the patients bleeding symptoms can be discerned.  We will conduct a basic work up examining using the PALM-COIEN classification system.  In the meantime the patient opts to trial no medicatino while we await results of her ultrasound and labs.  This may change once more of the workup is completed. Bleeding precautions reviewed.   Return in about 1 week (around 11/05/2019) for Pelvic u/s and follow up with Dr Glennon Mac.   Prentice Docker, MD 10/29/2019 1:18 PM    CC: Lloyd Huger, MD [address] [place],  [place] [id number]

## 2019-10-30 ENCOUNTER — Inpatient Hospital Stay: Payer: BC Managed Care – PPO | Attending: Oncology

## 2019-10-30 VITALS — BP 146/77 | HR 56 | Temp 98.1°F | Resp 20

## 2019-10-30 DIAGNOSIS — D509 Iron deficiency anemia, unspecified: Secondary | ICD-10-CM | POA: Diagnosis not present

## 2019-10-30 DIAGNOSIS — R531 Weakness: Secondary | ICD-10-CM | POA: Insufficient documentation

## 2019-10-30 DIAGNOSIS — R5383 Other fatigue: Secondary | ICD-10-CM | POA: Insufficient documentation

## 2019-10-30 DIAGNOSIS — F17210 Nicotine dependence, cigarettes, uncomplicated: Secondary | ICD-10-CM | POA: Insufficient documentation

## 2019-10-30 MED ORDER — SODIUM CHLORIDE 0.9 % IV SOLN
INTRAVENOUS | Status: DC
  Filled 2019-10-30: qty 250

## 2019-10-30 MED ORDER — SODIUM CHLORIDE 0.9 % IV SOLN
510.0000 mg | Freq: Once | INTRAVENOUS | Status: AC
  Administered 2019-10-30: 510 mg via INTRAVENOUS
  Filled 2019-10-30: qty 17

## 2019-11-05 ENCOUNTER — Other Ambulatory Visit: Payer: Self-pay | Admitting: Obstetrics and Gynecology

## 2019-11-05 ENCOUNTER — Other Ambulatory Visit: Payer: Self-pay

## 2019-11-05 ENCOUNTER — Ambulatory Visit (INDEPENDENT_AMBULATORY_CARE_PROVIDER_SITE_OTHER): Payer: BC Managed Care – PPO

## 2019-11-05 DIAGNOSIS — N920 Excessive and frequent menstruation with regular cycle: Secondary | ICD-10-CM

## 2019-11-05 DIAGNOSIS — D259 Leiomyoma of uterus, unspecified: Secondary | ICD-10-CM

## 2019-11-05 DIAGNOSIS — D500 Iron deficiency anemia secondary to blood loss (chronic): Secondary | ICD-10-CM | POA: Diagnosis not present

## 2019-11-12 ENCOUNTER — Telehealth: Payer: Self-pay

## 2019-11-12 ENCOUNTER — Other Ambulatory Visit (HOSPITAL_COMMUNITY)
Admission: RE | Admit: 2019-11-12 | Discharge: 2019-11-12 | Disposition: A | Discharge status: Home / Self Care | Payer: BC Managed Care – PPO | Source: Ambulatory Visit | Attending: Obstetrics and Gynecology | Admitting: Obstetrics and Gynecology

## 2019-11-12 ENCOUNTER — Other Ambulatory Visit: Payer: Self-pay

## 2019-11-12 ENCOUNTER — Encounter: Payer: Self-pay | Admitting: Obstetrics and Gynecology

## 2019-11-12 ENCOUNTER — Ambulatory Visit (INDEPENDENT_AMBULATORY_CARE_PROVIDER_SITE_OTHER): Payer: BC Managed Care – PPO | Admitting: Obstetrics and Gynecology

## 2019-11-12 VITALS — BP 149/85 | HR 84 | Ht 61.5 in | Wt 154.0 lb

## 2019-11-12 DIAGNOSIS — N920 Excessive and frequent menstruation with regular cycle: Secondary | ICD-10-CM | POA: Diagnosis not present

## 2019-11-12 DIAGNOSIS — D500 Iron deficiency anemia secondary to blood loss (chronic): Secondary | ICD-10-CM

## 2019-11-12 DIAGNOSIS — D252 Subserosal leiomyoma of uterus: Secondary | ICD-10-CM | POA: Diagnosis not present

## 2019-11-12 DIAGNOSIS — D251 Intramural leiomyoma of uterus: Secondary | ICD-10-CM

## 2019-11-12 MED ORDER — NORETHINDRONE 0.35 MG PO TABS: 1.0000 | ORAL_TABLET | Freq: Every day | ORAL | 4 refills | Status: DC

## 2019-11-12 MED ORDER — TRANEXAMIC ACID 650 MG PO TABS: 1300.0000 mg | ORAL_TABLET | Freq: Three times a day (TID) | ORAL | 2 refills | Status: DC

## 2019-11-12 NOTE — Progress Notes (Addendum)
Obstetrics & Gynecology Office Visit   Chief Complaint  Patient presents with  . Follow-up    GYN Ultrasound 7/8  For menorrhagia with regular period and resultant iron deficiency anemia.    History of Present Illness: 44 y.o. [id number] female who presents after a pelvic ultrasound for the above. She also presents for a pap smear and endometrial biopsy.    The ultrasound showed the following (from the report on 11/05/19): The uterus is anteverted and measures 11.8 x 7.6 x 8.4 cm.  Echo texture is heterogenous with evidence of focal masses.  Within the uterus are multiple suspected fibroids measuring: Fibroid 1: 26.6 x 18.5 x 20.8 mm intramural right, calcified Fibroid 2: 34.0 x 21.2 x 28.6 mm subserosal right anterior Fibroid 3: 36.3 x 27.0 x 33.2 mm intramural anterior Fibroid 4: 34.7 x 17.6 x 29.3 mm intramural fundal Fibroid 5: 50.0 x 31.0 x 38.6 mm subserosal posterior right Fibroid 6: 33.6 x 34.9 x 27.2 mm subserosal posterior left Fibroid 7: 17.4 x 13.3 x 16.7 mm intramural left Fibroid 8: 33.1 x 33.3 x 35.1 mm subserosal left Fibroid 9: 26.1 x 14.6 x 25.2 mm pedunculated posterior right  The Endometrium measures 11.8 mm.  Right Ovary measures 3.7 x 2.9 x 1.9 cm. It is normal in appearance. Left Ovary measures 3.7 x 2.1 x 2.0 cm. It is normal in appearance. Left ovarian follicle measures 01.0 x 15.9 x 13.4 mm.  Survey of the adnexa demonstrates no adnexal masses. There is no free fluid in the cul de sac.   Past Medical History:  Diagnosis Date  . Anemia   . Asthma     Past Surgical History:  Procedure Laterality Date  . CESAREAN SECTION  1997  . ECTOPIC PREGNANCY SURGERY  2000   x2  . HERNIA REPAIR    . LAPAROSCOPIC GASTRIC SLEEVE RESECTION  07/20/2014  . LAPAROTOMY N/A 04/29/2016   Procedure: EXPLORATORY LAPAROTOMY and appendectomy;  Surgeon: Florene Glen, MD;  Location: [facility] ORS;  Service: General;  Laterality: N/A;    Gynecologic History: Patient's  last menstrual period was 10/29/2019.  Obstetric History: [id number]  Family History  Problem Relation Age of Onset  . Hypertension Mother   . Hypertension Father   . Throat cancer Father   . Heart disease Father   . Hypertension Sister   . Hypertension Sister   . Uterine cancer Neg Hx   . Ovarian cancer Neg Hx     Social History   Socioeconomic History  . Marital status: Married    Spouse name: Not on file  . Number of children: Not on file  . Years of education: Not on file  . Highest education level: Not on file  Occupational History  . Not on file  Tobacco Use  . Smoking status: Current Some Day Smoker    Packs/day: 0.25    Years: 4.00    Pack years: 1.00    Types: Cigars  . Smokeless tobacco: Never Used  Vaping Use  . Vaping Use: Never used  Substance and Sexual Activity  . Alcohol use: Yes    Comment: 2 drinks wine/ weekly  . Drug use: No  . Sexual activity: Yes    Birth control/protection: Abstinence  Other Topics Concern  . Not on file  Social History Narrative  . Not on file   Social Determinants of Health   Financial Resource Strain:   . Difficulty of Paying Living Expenses:   Food Insecurity:   .  Worried About Charity fundraiser in the Last Year:   . Arboriculturist in the Last Year:   Transportation Needs:   . Film/video editor (Medical):   Marland Kitchen Lack of Transportation (Non-Medical):   Physical Activity:   . Days of Exercise per Week:   . Minutes of Exercise per Session:   Stress:   . Feeling of Stress :   Social Connections:   . Frequency of Communication with Friends and Family:   . Frequency of Social Gatherings with Friends and Family:   . Attends Religious Services:   . Active Member of Clubs or Organizations:   . Attends Archivist Meetings:   Marland Kitchen Marital Status:   Intimate Partner Violence:   . Fear of Current or Ex-Partner:   . Emotionally Abused:   Marland Kitchen Physically Abused:   . Sexually Abused:     Allergies  Allergen  Reactions  . Hydrocodone Hives and Itching  . Oxycodone Hives and Itching    Prior to Admission medications   Medication Sig Start Date End Date Taking? Authorizing Provider  albuterol (VENTOLIN HFA) 108 (90 Base) MCG/ACT inhaler Inhale 1 puff into the lungs as needed.  09/18/19  Yes [provider]  ferrous sulfate (FERROUSUL) 325 (65 FE) MG tablet Take 1 tablet (325 mg total) by mouth daily with breakfast. 09/23/19  Yes Drenda Freeze, MD  Multiple Vitamins-Minerals (MULTIVITAMIN WITH MINERALS) tablet Take 1 tablet by mouth daily.   Yes [provider]    Review of Systems  Constitutional: Positive for malaise/fatigue. Negative for chills, diaphoresis, fever and weight loss.  HENT: Negative.   Eyes: Negative.   Respiratory: Negative.   Cardiovascular: Negative.   Gastrointestinal: Negative.   Genitourinary: Negative.   Musculoskeletal: Negative.   Skin: Negative.   Neurological: Negative.   Psychiatric/Behavioral: Negative.      Physical Exam BP (!) 149/85   Pulse 84   Ht 5' 1.5" (1.562 m)   Wt 154 lb (69.9 kg)   LMP 10/29/2019   BMI 28.63 kg/m  Patient's last menstrual period was 10/29/2019. Physical Exam Constitutional:      General: She is not in acute distress.    Appearance: Normal appearance.  Genitourinary:     Pelvic exam was performed with patient in the lithotomy position.     Vulva, inguinal canal, urethra, bladder, vagina, cervix, right adnexa and left adnexa normal.     Uterus is enlarged, irregular and mobile.     Uterus is not tender.     No uterine mass detected. HENT:     Head: Normocephalic and atraumatic.  Eyes:     General: No scleral icterus.    Conjunctiva/sclera: Conjunctivae normal.  Neurological:     General: No focal deficit present.     Mental Status: She is alert and oriented to person, place, and time.     Cranial Nerves: No cranial nerve deficit.  Psychiatric:        Mood and Affect: Mood normal.         Behavior: Behavior normal.        Judgment: Judgment normal.    Endometrial Biopsy After discussion with the patient regarding her abnormal uterine bleeding I recommended that she proceed with an endometrial biopsy for further diagnosis. The risks, benefits, alternatives, and indications for an endometrial biopsy were discussed with the patient in detail. She understood the risks including infection, bleeding, cervical laceration and uterine perforation.  Verbal consent was obtained.  PROCEDURE NOTE:  Pipelle endometrial biopsy was performed using aseptic technique with iodine preparation.  The uterus was sounded to a length of 6 cm.  I attempted to use a dilator to get further into the endocervical/endometrial canal. However, this was as far as I could pass the dilator.  Adequate sampling was obtained with minimal blood loss.  Though, I did discuss with the patient that this would not be a sampling of the entire uterus and focal lesions could be missed with this biopsy.  The patient tolerated the procedure well.  Disposition will be pending pathology.  Female chaperone present for pelvic and breast  portions of the physical exam  Assessment: 44 y.o. [id number] female here for  1. Menorrhagia with regular cycle   2. Intramural and subserous leiomyoma of uterus   3. Iron deficiency anemia due to chronic blood loss      Plan: Problem List Items Addressed This Visit      Other   Iron deficiency anemia   Relevant Orders   Cytology - PAP   Surgical pathology    Other Visit Diagnoses    Menorrhagia with regular cycle    -  Primary   Relevant Orders   Cytology - PAP   Surgical pathology   Intramural and subserous leiomyoma of uterus         Discussed various treatment options, including; no treatment, medication, and surgical procedures. She wants to avoid surgery at this time. I discussed that the risk of this would be that we might need to try various medications in order to find the  correct one to get her bleeding under better control.  Given her blood pressure elevations at her two appointments with me, I would likely try to avoid estrogen.  Also, delaying surgery might allow the fibroids to grow such that a minimally invasive approach to hysterectomy might not be possible or it might be much more difficult.  She voiced understanding and agreement.  Pap smear and endometrial biopsy accomplished today.   Will start on daily norethindrone 0.35 mg. Add Lysteda for heavy bleeding.   A total of 25 minutes were spent face-to-face with the patient as well as preparation, review, communication, and documentation during this encounter.    Prentice Docker, MD 11/12/2019 9:21 AM     ADDENDUM: Pap smear normal Endometrial biopsy shows endometrial polyp.  Discussed possible hysteroscopy, dilation and curettage with polypectomy. Patient, after consideration, would like a hysterectomy. Will schedule.  Prentice Docker, MD, [facility], [facility] 11/16/2019 5:57 PM

## 2019-11-12 NOTE — Telephone Encounter (Signed)
Use of combination hormonal contraceptives and lysteda is contraindicated.  No reports of progesterone-only oral contraceptives and lysteda have been suggested.  So, this combination should be Ok. I read the package insert and it only specifies "Combined" or "combination" hormonal contraceptives (this means estrogen and progesterone) with Lysteda.  I'm prescribing a progesterone-only contraceptive, which is not a combined hormonal contraceptive.  Please let pharmacy know.

## 2019-11-12 NOTE — Telephone Encounter (Signed)
Catherine Hunter w/[facility] reports their system shows the norethindrone and tranexamic acid is contraindicated per the package insert for increase risk of blood clots. Calling to verify. [phone]

## 2019-11-12 NOTE — Telephone Encounter (Signed)
Notified pharmacy.

## 2019-11-13 ENCOUNTER — Telehealth: Payer: Self-pay | Admitting: Obstetrics and Gynecology

## 2019-11-13 DIAGNOSIS — N840 Polyp of corpus uteri: Secondary | ICD-10-CM

## 2019-11-13 DIAGNOSIS — N920 Excessive and frequent menstruation with regular cycle: Secondary | ICD-10-CM

## 2019-11-13 LAB — SURGICAL PATHOLOGY

## 2019-11-13 NOTE — Telephone Encounter (Signed)
Discussed finding of benign uterine polyp on endometrial biopsy yesterday.  Recommend hysteroscopy with dilation and curettage to remove the rest of the polyp and do better uterine endometrial assessment.  Discussed that it is possible that the entire polyp was removed or devitalized with the biopsy and that no polyp would be found under the procedure.  However, this may be a big source of her bleeding and does not respond well to medical treatment and has the ability to get bigger or become a bigger problem.  She voiced understanding and agreement to proceed with the proposed procedure.  We will schedule for soon.

## 2019-11-16 LAB — CYTOLOGY - PAP
Chlamydia: NEGATIVE
Comment: NEGATIVE
Comment: NEGATIVE
Comment: NORMAL
Diagnosis: NEGATIVE
High risk HPV: NEGATIVE
Neisseria Gonorrhea: NEGATIVE

## 2019-11-20 ENCOUNTER — Other Ambulatory Visit: Payer: Self-pay | Admitting: *Deleted

## 2019-11-20 DIAGNOSIS — D509 Iron deficiency anemia, unspecified: Secondary | ICD-10-CM

## 2019-11-20 NOTE — Progress Notes (Signed)
c 

## 2019-11-23 NOTE — Progress Notes (Signed)
[place]  [phone] [phone]  ID: Kennyth Arnold OB: [birth year]  MR#: [id number]  JJO#:[id number]  Patient Care Team: Patient, No Pcp Per as PCP - General (General Practice) Lloyd Huger, MD as Consulting Physician (Oncology)  CHIEF COMPLAINT: Iron deficiency anemia.  INTERVAL HISTORY: Patient returns to clinic today for repeat laboratory, further evaluation, and consideration of additional IV iron.  She continues to have weakness and fatigue, but this is significantly improved. She continues to have heavy menses.  She has no neurologic complaints.  She denies any recent fevers or illnesses.  She has a good appetite and denies weight loss.  She has no chest pain, shortness of breath, cough offices.  She denies any nausea, vomiting, constipation, or diarrhea.  She has no melena or night daily.  She has no urinary complaints.  Patient offers no further specific complaints today.  REVIEW OF SYSTEMS:   Review of Systems  Constitutional: Positive for malaise/fatigue. Negative for fever and weight loss.  Respiratory: Negative.  Negative for cough, hemoptysis and shortness of breath.   Cardiovascular: Negative.  Negative for chest pain and leg swelling.  Gastrointestinal: Negative.  Negative for abdominal pain, blood in stool and melena.  Genitourinary: Negative.  Negative for hematuria.  Musculoskeletal: Negative.  Negative for back pain.  Skin: Negative.  Negative for rash.  Neurological: Positive for weakness. Negative for dizziness, focal weakness and headaches.  Psychiatric/Behavioral: Negative.  The patient is not nervous/anxious.     As per HPI. Otherwise, a complete review of systems is negative.  PAST MEDICAL HISTORY: Past Medical History:  Diagnosis Date  . Anemia   . Asthma     PAST SURGICAL HISTORY: Past Surgical History:  Procedure Laterality Date  . CESAREAN SECTION  1997  . ECTOPIC PREGNANCY SURGERY  2000   x2  .  HERNIA REPAIR    . LAPAROSCOPIC GASTRIC SLEEVE RESECTION  07/20/2014  . LAPAROTOMY N/A 04/29/2016   Procedure: EXPLORATORY LAPAROTOMY and appendectomy;  Surgeon: Florene Glen, MD;  Location: [facility] ORS;  Service: General;  Laterality: N/A;    FAMILY HISTORY: Family History  Problem Relation Age of Onset  . Hypertension Mother   . Hypertension Father   . Throat cancer Father   . Heart disease Father   . Hypertension Sister   . Hypertension Sister   . Uterine cancer Neg Hx   . Ovarian cancer Neg Hx     ADVANCED DIRECTIVES (Y/N):  N  HEALTH MAINTENANCE: Social History   Tobacco Use  . Smoking status: Current Some Day Smoker    Packs/day: 0.25    Years: 4.00    Pack years: 1.00    Types: Cigars  . Smokeless tobacco: Never Used  Vaping Use  . Vaping Use: Never used  Substance Use Topics  . Alcohol use: Yes    Comment: 2 drinks wine/ weekly  . Drug use: No     Colonoscopy:  PAP:  Bone density:  Lipid panel:  Allergies  Allergen Reactions  . Hydrocodone Hives and Itching  . Oxycodone Hives and Itching    Current Outpatient Medications  Medication Sig Dispense Refill  . albuterol (VENTOLIN HFA) 108 (90 Base) MCG/ACT inhaler Inhale 1 puff into the lungs as needed.     . ferrous sulfate (FERROUSUL) 325 (65 FE) MG tablet Take 1 tablet (325 mg total) by mouth daily with breakfast. 30 tablet 1  . Multiple Vitamins-Minerals (MULTIVITAMIN WITH MINERALS) tablet Take 1 tablet by mouth daily.    Marland Kitchen  norethindrone (MICRONOR) 0.35 MG tablet Take 1 tablet (0.35 mg total) by mouth daily. 84 tablet 4  . tranexamic acid (LYSTEDA) 650 MG TABS tablet Take 2 tablets (1,300 mg total) by mouth 3 (three) times daily. Take during menses for a maximum of five days 30 tablet 2   No current facility-administered medications for this visit.    OBJECTIVE: Vitals:   11/26/19 1350  BP: (!) 144/87  Pulse: 70  Resp: 18  Temp: 97.6 F (36.4 C)     Body mass index is 29.09 kg/m.    ECOG  FS:0 - Asymptomatic  General: Well-developed, well-nourished, no acute distress. Eyes: Pink conjunctiva, anicteric sclera. HEENT: Normocephalic, moist mucous membranes. Lungs: No audible wheezing or coughing. Heart: Regular rate and rhythm. Abdomen: Soft, nontender, no obvious distention. Musculoskeletal: No edema, cyanosis, or clubbing. Neuro: Alert, answering all questions appropriately. Cranial nerves grossly intact. Skin: No rashes or petechiae noted. Psych: Normal affect.   LAB RESULTS:  Lab Results  Component Value Date   NA 134 (L) 09/23/2019   K 3.9 09/23/2019   CL 102 09/23/2019   CO2 21 (L) 09/23/2019   GLUCOSE 94 09/23/2019   BUN 9 09/23/2019   CREATININE 0.37 (L) 09/23/2019   CALCIUM 8.9 09/23/2019   PROT 8.0 09/23/2019   ALBUMIN 3.8 09/23/2019   AST 22 09/23/2019   ALT 20 09/23/2019   ALKPHOS 57 09/23/2019   BILITOT 0.7 09/23/2019   GFRNONAA >60 09/23/2019   GFRAA >60 09/23/2019    Lab Results  Component Value Date   WBC 3.0 (L) 11/26/2019   NEUTROABS 1.6 (L) 11/26/2019   HGB 10.6 (L) 11/26/2019   HCT 32.1 (L) 11/26/2019   MCV 91.5 11/26/2019   PLT 188 11/26/2019   Lab Results  Component Value Date   IRON 70 11/26/2019   TIBC 321 11/26/2019   IRONPCTSAT 22 11/26/2019   Lab Results  Component Value Date   FERRITIN 87 11/26/2019     STUDIES: US PELVIC COMPLETE WITH TRANSVAGINAL  Result Date: 11/05/2019 Patient Name: RENI HAUSNER DOB: 1976/02/15 MRN: [id number] ULTRASOUND REPORT Location: [facility] Date of Service: 11/05/2019 Indications: Fibroids Findings: The uterus is anteverted and measures 11.8 x 7.6 x 8.4 cm. Echo texture is heterogenous with evidence of focal masses. Within the uterus are multiple suspected fibroids measuring: Fibroid 1: 26.6 x 18.5 x 20.8 mm intramural right, calcified Fibroid 2: 34.0 x 21.2 x 28.6 mm subserosal right anterior Fibroid 3: 36.3 x 27.0 x 33.2 mm intramural anterior Fibroid 4: 34.7 x 17.6 x 29.3 mm  intramural fundal Fibroid 5: 50.0 x 31.0 x 38.6 mm subserosal posterior right Fibroid 6: 33.6 x 34.9 x 27.2 mm subserosal posterior left Fibroid 7: 17.4 x 13.3 x 16.7 mm intramural left Fibroid 8: 33.1 x 33.3 x 35.1 mm subserosal left Fibroid 9: 26.1 x 14.6 x 25.2 mm pedunculated posterior right The Endometrium measures 11.8 mm. Right Ovary measures 3.7 x 2.9 x 1.9 cm. It is normal in appearance. Left Ovary measures 3.7 x 2.1 x 2.0 cm. It is normal in appearance. Left ovarian follicle measures 26.7 x 15.9 x 13.4 mm. Survey of the adnexa demonstrates no adnexal masses. There is no free fluid in the cul de sac. Impression: 1. There are at least 9 uterine fibroids. 2. The endometrium is thick but there are no obvious endometrial polyps or fibroids 3. Normal appearing cervix and ovaries. Gweneth Dimitri, RT The ultrasound images and findings were reviewed by me and I agree  with the above report. Prentice Docker, MD, [facility], [facility] 11/05/2019 9:04 PM      ASSESSMENT: Iron deficiency anemia.  PLAN:    1.  Iron deficiency anemia: Likely secondary to heavy menses.  Patient's hemoglobin has improved to 10.6, but she remains mildly symptomatic.  Iron stores are now within normal limits, but despite this will proceed with additional IV Feraheme today.  She does not require second infusion.  Return to clinic in 6 weeks with repeat laboratory work, further evaluation, and continuation of treatment if needed.   2.  Heavy menses: Patient has been seen by OB/GYN and is considering hysterectomy in the near future.  I spent a total of 30 minutes reviewing chart data, face-to-face evaluation with the patient, counseling and coordination of care as detailed above.   Patient expressed understanding and was in agreement with this plan. She also understands that She can call clinic at any time with any questions, concerns, or complaints.    Lloyd Huger, MD   11/28/2019 9:12  AM

## 2019-11-25 ENCOUNTER — Encounter: Payer: Self-pay | Admitting: Oncology

## 2019-11-25 NOTE — Progress Notes (Signed)
Patient was called for pre assessment. She denies any pain or concerns at this time.  

## 2019-11-26 ENCOUNTER — Other Ambulatory Visit: Payer: Self-pay

## 2019-11-26 ENCOUNTER — Inpatient Hospital Stay: Payer: BC Managed Care – PPO

## 2019-11-26 ENCOUNTER — Inpatient Hospital Stay (HOSPITAL_BASED_OUTPATIENT_CLINIC_OR_DEPARTMENT_OTHER): Payer: BC Managed Care – PPO | Admitting: Oncology

## 2019-11-26 VITALS — BP 158/90 | HR 61 | Resp 16

## 2019-11-26 VITALS — BP 144/87 | HR 70 | Temp 97.6°F | Resp 18 | Wt 156.5 lb

## 2019-11-26 DIAGNOSIS — D509 Iron deficiency anemia, unspecified: Secondary | ICD-10-CM | POA: Diagnosis not present

## 2019-11-26 DIAGNOSIS — D500 Iron deficiency anemia secondary to blood loss (chronic): Secondary | ICD-10-CM

## 2019-11-26 LAB — CBC WITH DIFFERENTIAL/PLATELET
Abs Immature Granulocytes: 0.01 10*3/uL (ref 0.00–0.07)
Basophils Absolute: 0 10*3/uL (ref 0.0–0.1)
Basophils Relative: 1 %
Eosinophils Absolute: 0.1 10*3/uL (ref 0.0–0.5)
Eosinophils Relative: 2 %
HCT: 32.1 % — ABNORMAL LOW (ref 36.0–46.0)
Hemoglobin: 10.6 g/dL — ABNORMAL LOW (ref 12.0–15.0)
Immature Granulocytes: 0 %
Lymphocytes Relative: 26 %
Lymphs Abs: 0.8 10*3/uL (ref 0.7–4.0)
MCH: 30.2 pg (ref 26.0–34.0)
MCHC: 33 g/dL (ref 30.0–36.0)
MCV: 91.5 fL (ref 80.0–100.0)
Monocytes Absolute: 0.4 10*3/uL (ref 0.1–1.0)
Monocytes Relative: 15 %
Neutro Abs: 1.6 10*3/uL — ABNORMAL LOW (ref 1.7–7.7)
Neutrophils Relative %: 56 %
Platelets: 188 10*3/uL (ref 150–400)
RBC: 3.51 MIL/uL — ABNORMAL LOW (ref 3.87–5.11)
RDW: 21.6 % — ABNORMAL HIGH (ref 11.5–15.5)
WBC: 3 10*3/uL — ABNORMAL LOW (ref 4.0–10.5)
nRBC: 0 % (ref 0.0–0.2)

## 2019-11-26 LAB — FERRITIN: Ferritin: 87 ng/mL (ref 11–307)

## 2019-11-26 LAB — IRON AND TIBC
Iron: 70 ug/dL (ref 28–170)
Saturation Ratios: 22 % (ref 10.4–31.8)
TIBC: 321 ug/dL (ref 250–450)
UIBC: 251 ug/dL

## 2019-11-26 MED ORDER — SODIUM CHLORIDE 0.9 % IV SOLN
510.0000 mg | Freq: Once | INTRAVENOUS | Status: AC
  Administered 2019-11-26: 510 mg via INTRAVENOUS
  Filled 2019-11-26: qty 510

## 2019-11-26 MED ORDER — FERROUS SULFATE 325 (65 FE) MG PO TABS: 325.0000 mg | ORAL_TABLET | Freq: Every day | ORAL | 1 refills | Status: AC

## 2019-11-26 MED ORDER — SODIUM CHLORIDE 0.9 % IV SOLN
Freq: Once | INTRAVENOUS | Status: AC
  Filled 2019-11-26: qty 250

## 2019-12-03 ENCOUNTER — Inpatient Hospital Stay: Payer: BC Managed Care – PPO

## 2020-01-01 ENCOUNTER — Encounter: Payer: Self-pay | Admitting: Oncology

## 2020-01-01 NOTE — Progress Notes (Signed)
Patient states she has no concerns or questions at this time.

## 2020-01-02 NOTE — Progress Notes (Signed)
  [place]  [phone] [phone]  ID: Kennyth Arnold OB: 22-Dec-1975  MR#: [id number]  ZTI#:[id number]  Patient Care Team: Patient, No Pcp Per as PCP - General (General Practice) Lloyd Huger, MD as Consulting Physician (Oncology)      Lloyd Huger, MD   01/06/2020 12:12 PM     This encounter was created in error - please disregard.

## 2020-01-05 ENCOUNTER — Inpatient Hospital Stay: Payer: BC Managed Care – PPO

## 2020-01-05 ENCOUNTER — Inpatient Hospital Stay: Payer: BC Managed Care – PPO | Admitting: Oncology

## 2020-02-11 ENCOUNTER — Ambulatory Visit: Payer: BC Managed Care – PPO | Admitting: Obstetrics and Gynecology

## 2020-02-18 ENCOUNTER — Telehealth: Payer: Self-pay | Admitting: Obstetrics and Gynecology

## 2020-02-18 NOTE — Telephone Encounter (Signed)
Patient sent a My Chart msg saying she is ready to schedule her surgery. She said her last period was awful.  I spoke to patient back in July and she wasn't ready to schedule and wanted to discuss more with her husband. She adv that she would contact me when she was ready to schedule. (I have notes on her paper surgery request but failed to document this in Epic.)  DOS 12/2 w Glennon Mac for Total laparoscopic hysterectomy, bilateral salpingectomy, cystoscopy   H&P 11/23 @ 8:10   Covid testing 11/30 @ 8-10:30, [address], drive up and wear mask. Advised pt to quarantine until DOS.  Pre-admit phone call appointment to be requested - date and time will be included on H&P paper work. Also all appointments will be updated on pt MyChart. Explained that this appointment has a call window. Based on the time scheduled will indicate if the call will be received within a 4 hour window before 1:00 or after.  Advised that pt may also receive calls from the hospital pharmacy and pre-service center.  Confirmed pt has [employer] as Chartered certified accountant. No secondary insurance.

## 2020-02-18 NOTE — Telephone Encounter (Signed)
-----   Message from Will Bonnet, MD sent at 11/16/2019  5:55 PM EDT ----- Regarding: Schedule surgery Surgery Booking Request Patient Full Name:  Catherine Hunter  MRN: [id number]  DOB: [birth year]  Surgeon: Prentice Docker, MD  Requested Surgery Date and Time: TBD Primary Diagnosis AND Code:  1) Menorrhagia with regular cycle [N92.0] 2) Intramural and submucous leiomyoma of uterus [D25.1, D25.2] 3) Iron deficiency anemia due to chronic blood loss [D50.0] 4) Endometrial polyp [N84.0] Secondary Diagnosis and Code:  Surgical Procedure: Total laparoscopic hysterectomy, bilateral salpingectomy, cystoscopy RNFA Requested?: No L&D Notification: No Admission Status: same day surgery Length of Surgery: 125 min Special Case Needs: No H&P: Yes Phone Interview???:  Yes Interpreter: No Medical Clearance:  No Special Scheduling Instructions: No Any known health/anesthesia issues, diabetes, sleep apnea, latex allergy, defibrillator/pacemaker?: No Acuity: P3   (P1 highest, P2 delay may cause harm, P3 low, elective gyn, P4 lowest)

## 2020-03-22 ENCOUNTER — Ambulatory Visit (INDEPENDENT_AMBULATORY_CARE_PROVIDER_SITE_OTHER): Payer: BC Managed Care – PPO | Admitting: Obstetrics and Gynecology

## 2020-03-22 ENCOUNTER — Other Ambulatory Visit: Payer: Self-pay

## 2020-03-22 ENCOUNTER — Encounter: Payer: Self-pay | Admitting: Obstetrics and Gynecology

## 2020-03-22 VITALS — BP 124/74 | Ht 61.0 in | Wt 161.0 lb

## 2020-03-22 DIAGNOSIS — D251 Intramural leiomyoma of uterus: Secondary | ICD-10-CM

## 2020-03-22 DIAGNOSIS — D252 Subserosal leiomyoma of uterus: Secondary | ICD-10-CM

## 2020-03-22 DIAGNOSIS — N920 Excessive and frequent menstruation with regular cycle: Secondary | ICD-10-CM

## 2020-03-22 DIAGNOSIS — N840 Polyp of corpus uteri: Secondary | ICD-10-CM

## 2020-03-22 DIAGNOSIS — D500 Iron deficiency anemia secondary to blood loss (chronic): Secondary | ICD-10-CM

## 2020-03-22 NOTE — Progress Notes (Signed)
Preoperative History and Physical  Catherine Hunter is a 44 y.o. [id number] here for surgical management of Menorrhagia with regular cycle, intramural and subserous leiomyoma of the uterus, endometrial polyp, and iron deficiency anemia as a result of heavy menses.   No significant preoperative concerns.  History of Present Illness: 44 y.o. G9P0020 female who has the above issues.  In May of this year she presented to the ER with a hemoglobin of 4.6 after initially presenting with significant weakness and fatigue.  She received 2 units pRBCs.  She has been receiving iron infusions at the cancer center. She has had two iron infusions through [facility].  She hasn't felt a big improvement in her symptoms.   After her ER visit in May, she started her cycle shortly after.  She has been having heavy periods for years. She believes that they have become heavier more recently, especially the past several months.  She believes she has only received a blood transfusion once for this. She did receive a blood transfusion after her son was born 77 years ago.  She has received iron infusions in the past.   She has received no medication to lessen the bleeding.  She gets a period about every 28 days, lasting five days.  Day 4-5 the bleeding is usually not as bad.   She uses nothing for contraception.  She has had two tubal pregnancies.  She thinks her chances are probably low she could get pregnant.  She denies weight changes, constipation, bloating, early satiety.  She had a normal pap smear. Endometrial polyp on endometrial biopsy  U/S on 11/05/2019 (from report): The uterus is antevertedand measures 11.8 x 7.6 x 8.4 cm.  Echo texture is heterogenouswithevidence of focal masses.  Within the uterus are multiple suspected fibroids measuring: Fibroid 1: 26.6 x 18.5 x 20.8 mm intramural right, calcified Fibroid 2: 34.0 x 21.2 x 28.6 mm subserosal right anterior Fibroid 3: 36.3 x 27.0 x 33.2 mm intramural  anterior Fibroid 4: 34.7 x 17.6 x 29.3 mm intramural fundal Fibroid 5: 50.0 x 31.0 x 38.6 mm subserosal posterior right Fibroid 6: 33.6 x 34.9 x 27.2 mm subserosal posterior left Fibroid 7: 17.4 x 13.3 x 16.7 mm intramural left Fibroid 8: 33.1 x 33.3 x 35.1 mm subserosal left Fibroid 9: 26.1 x 14.6 x 25.2 mm pedunculated posterior right  The Endometriummeasures 11.8 mm.  Right Ovary measures 3.7 x 2.9 x 1.9 cm. It is normal in appearance. Left Ovary measures 3.7 x 2.1 x 2.0 cm. It is normal in appearance. Left ovarian follicle measures 70.4 x 15.9 x 13.4 mm.  Survey of the adnexa demonstrates no adnexal masses. There is no free fluid in the cul de sac.  She has a surgical history notable for cesarean section, two surgeries for ectopic pregnancy, hernia repair, laparoscopic gastric sleeve resection, and exploratory laparotomy for small bowel obstruction with lysis of adhesions and incidental appendectomy  Proposed surgery: Total laparoscopic hysterectomy and bilateral salpingectomy, cystoscopy.   Past Medical History:  Diagnosis Date  . Anemia   . Asthma    Past Surgical History:  Procedure Laterality Date  . CESAREAN SECTION  1997  . ECTOPIC PREGNANCY SURGERY  2000   x2  . HERNIA REPAIR    . LAPAROSCOPIC GASTRIC SLEEVE RESECTION  07/20/2014  . LAPAROTOMY N/A 04/29/2016   Procedure: EXPLORATORY LAPAROTOMY and appendectomy;  Surgeon: Florene Glen, MD;  Location: [facility] ORS;  Service: General;  Laterality: N/A;   OB History  Durenda Age  Para Term Preterm AB Living  5 1     2     SAB TAB Ectopic Multiple Live Births      2        # Outcome Date GA Lbr Len/2nd Weight Sex Delivery Anes PTL Lv  5 Gravida           4 Gravida           3 Ectopic           2 Ectopic           1 Para           Patient denies any other pertinent gynecologic issues.   Current Outpatient Medications on File Prior to Visit  Medication Sig Dispense Refill  . albuterol (VENTOLIN HFA) 108 (90 Base)  MCG/ACT inhaler Inhale 1 puff into the lungs every 6 (six) hours as needed for wheezing or shortness of breath.     . ferrous sulfate (FERROUSUL) 325 (65 FE) MG tablet Take 1 tablet (325 mg total) by mouth daily with breakfast. 30 tablet 1  . Multiple Vitamins-Minerals (MULTIVITAMIN WITH MINERALS) tablet Take 1 tablet by mouth 4 (four) times a week.     . norethindrone (MICRONOR) 0.35 MG tablet Take 1 tablet (0.35 mg total) by mouth daily. (Patient not taking: Reported on 03/21/2020) 84 tablet 4  . tranexamic acid (LYSTEDA) 650 MG TABS tablet Take 2 tablets (1,300 mg total) by mouth 3 (three) times daily. Take during menses for a maximum of five days (Patient not taking: Reported on 03/21/2020) 30 tablet 2   No current facility-administered medications on file prior to visit.   Allergies  Allergen Reactions  . Hydrocodone Hives and Itching  . Oxycodone Hives and Itching    Social History:   reports that she has been smoking cigars. She has a 1.00 pack-year smoking history. She has never used smokeless tobacco. She reports current alcohol use. She reports that she does not use drugs.  Family History  Problem Relation Age of Onset  . Hypertension Mother   . Hypertension Father   . Throat cancer Father   . Heart disease Father   . Hypertension Sister   . Hypertension Sister   . Uterine cancer Neg Hx   . Ovarian cancer Neg Hx     Review of Systems: Noncontributory  PHYSICAL EXAM: BP 124/74   Ht 5\' 1"  (1.549 m)   Wt 161 lb (73 kg)   BMI 30.42 kg/m   CONSTITUTIONAL: Well-developed, well-nourished female in no acute distress.  HENT:  Normocephalic, atraumatic, External right and left ear normal. Oropharynx is clear and moist EYES: Conjunctivae and EOM are normal. Pupils are equal, round, and reactive to light. No scleral icterus.  NECK: Normal range of motion, supple, no masses SKIN: Skin is warm and dry. No rash noted. Not diaphoretic. No erythema. No pallor. [place]: Alert and  oriented to person, place, and time. Normal reflexes, muscle tone coordination. No cranial nerve deficit noted. PSYCHIATRIC: Normal mood and affect. Normal behavior. Normal judgment and thought content. CARDIOVASCULAR: Normal heart rate noted, regular rhythm RESPIRATORY: Effort and breath sounds normal, no problems with respiration noted ABDOMEN: Soft, nontender, nondistended. PELVIC: Deferred MUSCULOSKELETAL: Normal range of motion. No edema and no tenderness. 2+ distal pulses.  Labs: No results found for this or any previous visit (from the past 336 hour(s)).  Imaging Studies: No results found.  Assessment:   ICD-10-CM   1. Menorrhagia with regular cycle  N92.0  2. Endometrial polyp  N84.0   3. Intramural and subserous leiomyoma of uterus  D25.1    D25.2   4. Iron deficiency anemia due to chronic blood loss  D50.0     Plan: Patient will undergo surgical management with the above surgery. With her history of multiple abdominal surgeries, including a laparotomy with lysis of adhesions when she had a small bowel obstruction, she is at risk for a laparotomy for her hysterectomy.   There is also a chance that she will have so much scar tissue that I would abandon the surgery in favor of having her have the surgery at another center.  The risks of surgery were discussed in detail with the patient including but not limited to: bleeding which may require transfusion or reoperation; infection which may require antibiotics; injury to surrounding organs which may involve bowel, bladder, ureters ; need for additional procedures including laparoscopy or laparotomy; thromboembolic phenomenon, surgical site problems and other postoperative/anesthesia complications. Likelihood of success in alleviating the patient's condition was discussed. Routine postoperative instructions will be reviewed with the patient and her family in detail after surgery.  The patient concurred with the proposed plan, giving  informed written consent for the surgery.  Preoperative prophylactic antibiotics, as indicated, and SCDs ordered on call to the OR.    Prentice Docker, MD 03/22/2020 8:15 AM

## 2020-03-22 NOTE — H&P (View-Only) (Signed)
Preoperative History and Physical  Catherine Hunter is a 44 y.o. 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   No significant preoperative concerns.  History of Present Illness: 44 y.o. G17P0020 female who has the above issues.  In May of this year she presented to the ER with a hemoglobin of 4.6 after initially presenting with significant weakness and fatigue.  She received 2 units pRBCs.  She has been receiving iron infusions at the cancer center. She has had two iron infusions through [facility].  She hasn't felt a big improvement in her symptoms.   After her ER visit in May, she started her cycle shortly after.  She has been having heavy periods for years. She believes that they have become heavier more recently, especially the past several months.  She believes she has only received a blood transfusion once for this. She did receive a blood transfusion after her son was born 40 years ago.  She has received iron infusions in the past.   She has received no medication to lessen the bleeding.  She gets a period about every 28 days, lasting five days.  Day 4-5 the bleeding is usually not as bad.   She uses nothing for contraception.  She has had two tubal pregnancies.  She thinks her chances are probably low she could get pregnant.  She denies weight changes, constipation, bloating, early satiety.  She had a normal pap smear. Endometrial polyp on endometrial biopsy  U/S on 11/05/2019 (from report): The uterus is antevertedand measures 11.8 x 7.6 x 8.4 cm.  Echo texture is heterogenouswithevidence of focal masses.  Within the uterus are multiple suspected fibroids measuring: Fibroid 1: 26.6 x 18.5 x 20.8 mm intramural right, calcified Fibroid 2: 34.0 x 21.2 x 28.6 mm subserosal right anterior Fibroid 3: 36.3 x 27.0 x 33.2 mm intramural  anterior Fibroid 4: 34.7 x 17.6 x 29.3 mm intramural fundal Fibroid 5: 50.0 x 31.0 x 38.6 mm subserosal posterior right Fibroid 6: 33.6 x 34.9 x 27.2 mm subserosal posterior left Fibroid 7: 17.4 x 13.3 x 16.7 mm intramural left Fibroid 8: 33.1 x 33.3 x 35.1 mm subserosal left Fibroid 9: 26.1 x 14.6 x 25.2 mm pedunculated posterior right  The Endometriummeasures 11.8 mm.  Right Ovary measures 3.7 x 2.9 x 1.9 cm. It is normal in appearance. Left Ovary measures 3.7 x 2.1 x 2.0 cm. It is normal in appearance. Left ovarian follicle measures 24.0 x 15.9 x 13.4 mm.  Survey of the adnexa demonstrates no adnexal masses. There is no free fluid in the cul de sac.  She has a surgical history notable for cesarean section, two surgeries for ectopic pregnancy, hernia repair, laparoscopic gastric sleeve resection, and exploratory laparotomy for small bowel obstruction with lysis of adhesions and incidental appendectomy  Proposed surgery: Total laparoscopic hysterectomy and bilateral salpingectomy, cystoscopy.   Past Medical History:  Diagnosis Date  . Anemia   . Asthma    Past Surgical History:  Procedure Laterality Date  . CESAREAN SECTION  1997  . ECTOPIC PREGNANCY SURGERY  2000   x2  . HERNIA REPAIR    . LAPAROSCOPIC GASTRIC SLEEVE RESECTION  07/20/2014  . LAPAROTOMY N/A 04/29/2016   Procedure: EXPLORATORY LAPAROTOMY and appendectomy;  Surgeon: Florene Glen, MD;  Location: [facility] ORS;  Service: General;  Laterality: N/A;   OB History  Catherine Hunter  Para Term Preterm AB Living  5 1     2     SAB TAB Ectopic Multiple Live Births      2        # Outcome Date GA Lbr Len/2nd Weight Sex Delivery Anes PTL Lv  5 Gravida           4 Gravida           3 Ectopic           2 Ectopic           1 Para           Patient denies any other pertinent gynecologic issues.   Current Outpatient Medications on File Prior to Visit  Medication Sig Dispense Refill  . albuterol (VENTOLIN HFA) 108 (90 Base)  MCG/ACT inhaler Inhale 1 puff into the lungs every 6 (six) hours as needed for wheezing or shortness of breath.     . ferrous sulfate (FERROUSUL) 325 (65 FE) MG tablet Take 1 tablet (325 mg total) by mouth daily with breakfast. 30 tablet 1  . Multiple Vitamins-Minerals (MULTIVITAMIN WITH MINERALS) tablet Take 1 tablet by mouth 4 (four) times a week.     . norethindrone (MICRONOR) 0.35 MG tablet Take 1 tablet (0.35 mg total) by mouth daily. (Patient not taking: Reported on 03/21/2020) 84 tablet 4  . tranexamic acid (LYSTEDA) 650 MG TABS tablet Take 2 tablets (1,300 mg total) by mouth 3 (three) times daily. Take during menses for a maximum of five days (Patient not taking: Reported on 03/21/2020) 30 tablet 2   No current facility-administered medications on file prior to visit.   Allergies  Allergen Reactions  . Hydrocodone Hives and Itching  . Oxycodone Hives and Itching    Social History:   reports that she has been smoking cigars. She has a 1.00 pack-year smoking history. She has never used smokeless tobacco. She reports current alcohol use. She reports that she does not use drugs.  Family History  Problem Relation Hunter of Onset  . Hypertension Mother   . Hypertension Father   . Throat cancer Father   . Heart disease Father   . Hypertension Sister   . Hypertension Sister   . Uterine cancer Neg Hx   . Ovarian cancer Neg Hx     Review of Systems: Noncontributory  PHYSICAL EXAM: BP 124/74   Ht 5\' 1"  (1.549 m)   Wt 161 lb (73 kg)   BMI 30.42 kg/m   CONSTITUTIONAL: Well-developed, well-nourished female in no acute distress.  HENT:  Normocephalic, atraumatic, External right and left ear normal. Oropharynx is clear and moist EYES: Conjunctivae and EOM are normal. Pupils are equal, round, and reactive to light. No scleral icterus.  NECK: Normal range of motion, supple, no masses SKIN: Skin is warm and dry. No rash noted. Not diaphoretic. No erythema. No pallor. [place]: Alert and  oriented to person, place, and time. Normal reflexes, muscle tone coordination. No cranial nerve deficit noted. PSYCHIATRIC: Normal mood and affect. Normal behavior. Normal judgment and thought content. CARDIOVASCULAR: Normal heart rate noted, regular rhythm RESPIRATORY: Effort and breath sounds normal, no problems with respiration noted ABDOMEN: Soft, nontender, nondistended. PELVIC: Deferred MUSCULOSKELETAL: Normal range of motion. No edema and no tenderness. 2+ distal pulses.  Labs: No results found for this or any previous visit (from the past 336 hour(s)).  Imaging Studies: No results found.  Assessment:   ICD-10-CM   1. Menorrhagia with regular cycle  N92.0  2. Endometrial polyp  N84.0   3. Intramural and subserous leiomyoma of uterus  D25.1    D25.2   4. Iron deficiency anemia due to chronic blood loss  D50.0     Plan: Patient will undergo surgical management with the above surgery. With her history of multiple abdominal surgeries, including a laparotomy with lysis of adhesions when she had a small bowel obstruction, she is at risk for a laparotomy for her hysterectomy.   There is also a chance that she will have so much scar tissue that I would abandon the surgery in favor of having her have the surgery at another center.  The risks of surgery were discussed in detail with the patient including but not limited to: bleeding which may require transfusion or reoperation; infection which may require antibiotics; injury to surrounding organs which may involve bowel, bladder, ureters ; need for additional procedures including laparoscopy or laparotomy; thromboembolic phenomenon, surgical site problems and other postoperative/anesthesia complications. Likelihood of success in alleviating the patient's condition was discussed. Routine postoperative instructions will be reviewed with the patient and her family in detail after surgery.  The patient concurred with the proposed plan, giving  informed written consent for the surgery.  Preoperative prophylactic antibiotics, as indicated, and SCDs ordered on call to the OR.    Prentice Docker, MD 03/22/2020 8:15 AM

## 2020-03-23 ENCOUNTER — Encounter
Admission: RE | Admit: 2020-03-23 | Discharge: 2020-03-23 | Disposition: A | Discharge status: Home / Self Care | Payer: BC Managed Care – PPO | Source: Ambulatory Visit | Attending: Obstetrics and Gynecology | Admitting: Obstetrics and Gynecology

## 2020-03-23 ENCOUNTER — Telehealth: Payer: Self-pay | Admitting: Obstetrics and Gynecology

## 2020-03-23 HISTORY — DX: Anxiety disorder, unspecified: F41.9

## 2020-03-23 HISTORY — DX: Family history of other specified conditions: Z84.89

## 2020-03-23 NOTE — Patient Instructions (Signed)
Your procedure is scheduled on: 03/31/20 Report to [facility] AFTER CHECKING IN AT [facility]. To find out your arrival time please call [phone] between 1PM - 3PM on 03/30/20.  Remember: Instructions that are not followed completely may result in serious medical risk, up to and including death, or upon the discretion of your surgeon and anesthesiologist your surgery may need to be rescheduled.     _X__ 1. Do not eat food after midnight the night before your procedure.                 No gum chewing or hard candies. You may drink clear liquids up to 2 hours                 before you are scheduled to arrive for your surgery- DO not drink clear                 liquids within 2 hours of the start of your surgery.                 Clear Liquids include:  water, apple juice without pulp, clear carbohydrate                 drink such as Clearfast or Gatorade, Black Coffee or Tea (Do not add                 anything to coffee or tea). Diabetics water only   __X__2.  On the morning of surgery brush your teeth with toothpaste and water, you                 may rinse your mouth with mouthwash if you wish.  Do not swallow any              toothpaste of mouthwash.     _X__ 3.  No Alcohol for 24 hours before or after surgery.   _X__ 4.  Do Not Smoke or use e-cigarettes For 24 Hours Prior to Your Surgery.                 Do not use any chewable tobacco products for at least 6 hours prior to                 surgery.  ____  5.  Bring all medications with you on the day of surgery if instructed.   __X__  6.  Notify your doctor if there is any change in your medical condition      (cold, fever, infections).     Do not wear jewelry, make-up, hairpins, clips or nail polish. Do not wear lotions, powders, or perfumes.  Do not shave 48 hours prior to surgery. Men may shave face and neck. Do not bring valuables to the hospital.    [facility] is not responsible for any belongings or valuables.  Contacts, dentures/partials or body piercings may not be worn into surgery. Bring a case for your contacts, glasses or hearing aids, a denture cup will be supplied. Leave your suitcase in the car. After surgery it may be brought to your room. For patients admitted to the hospital, discharge time is determined by your treatment team.   Patients discharged the day of surgery will not be allowed to drive home.   Please read over the following fact sheets that you were given:   CHG SOAP, ENSURE DRINK  __X__ Take these medicines the  morning of surgery with A SIP OF WATER:    1. NONE  2.   3.   4.  5.  6.  ____ Fleet Enema (as directed)   __X__ Use CHG Soap/SAGE wipes as directed  __X__ Use inhalers on the day of surgery  ____ Stop metformin/Janumet/Farxiga 2 days prior to surgery    ____ Take 1/2 of usual insulin dose the night before surgery. No insulin the morning          of surgery.   ____ Stop Blood Thinners Coumadin/Plavix/Xarelto/Pleta/Pradaxa/Eliquis/Effient/Aspirin  on   Or contact your Surgeon, Cardiologist or Medical Doctor regarding  ability to stop your blood thinners  __X__ Stop Anti-inflammatories 7 days before surgery such as Advil, Ibuprofen, Motrin,  BC or Goodies Powder, Naprosyn, Naproxen, Aleve, Aspirin    __X__ Stop all herbal supplements, fish oil or vitamin E until after surgery.    ____ Bring C-Pap to the hospital.    THE ENSURE "CLEAR" PRE SURGERY DRINK SHOULD BE FINISHED 2 HOURS BEFORE ARRIVING

## 2020-03-23 NOTE — Telephone Encounter (Signed)
Patient calling to check status of fmla paperwork. Pt states surgery is scheduled for Thursday  03/31/2020 and she needs it approved and faxed over before then.

## 2020-03-28 NOTE — Telephone Encounter (Signed)
Faxing paper work today. Called pt. Got disconnected. Please let her know if she calls back

## 2020-03-29 ENCOUNTER — Other Ambulatory Visit: Payer: Self-pay

## 2020-03-29 ENCOUNTER — Other Ambulatory Visit
Admission: RE | Admit: 2020-03-29 | Discharge: 2020-03-29 | Disposition: A | Discharge status: Home / Self Care | Payer: BC Managed Care – PPO | Source: Ambulatory Visit | Attending: Obstetrics and Gynecology | Admitting: Obstetrics and Gynecology

## 2020-03-29 DIAGNOSIS — D251 Intramural leiomyoma of uterus: Secondary | ICD-10-CM | POA: Diagnosis not present

## 2020-03-29 DIAGNOSIS — N920 Excessive and frequent menstruation with regular cycle: Secondary | ICD-10-CM | POA: Diagnosis present

## 2020-03-29 DIAGNOSIS — Z885 Allergy status to narcotic agent status: Secondary | ICD-10-CM | POA: Diagnosis not present

## 2020-03-29 DIAGNOSIS — F17290 Nicotine dependence, other tobacco product, uncomplicated: Secondary | ICD-10-CM | POA: Diagnosis not present

## 2020-03-29 DIAGNOSIS — Z20822 Contact with and (suspected) exposure to covid-19: Secondary | ICD-10-CM | POA: Insufficient documentation

## 2020-03-29 DIAGNOSIS — Z79899 Other long term (current) drug therapy: Secondary | ICD-10-CM | POA: Diagnosis not present

## 2020-03-29 DIAGNOSIS — D500 Iron deficiency anemia secondary to blood loss (chronic): Secondary | ICD-10-CM | POA: Diagnosis not present

## 2020-03-29 DIAGNOSIS — N840 Polyp of corpus uteri: Secondary | ICD-10-CM | POA: Diagnosis not present

## 2020-03-29 DIAGNOSIS — Z01812 Encounter for preprocedural laboratory examination: Secondary | ICD-10-CM | POA: Insufficient documentation

## 2020-03-29 DIAGNOSIS — N800 Endometriosis of uterus: Secondary | ICD-10-CM | POA: Diagnosis not present

## 2020-03-29 DIAGNOSIS — D252 Subserosal leiomyoma of uterus: Secondary | ICD-10-CM | POA: Diagnosis not present

## 2020-03-29 LAB — TYPE AND SCREEN
ABO/RH(D): B POS
Antibody Screen: NEGATIVE

## 2020-03-29 LAB — CBC
HCT: 32.8 % — ABNORMAL LOW (ref 36.0–46.0)
Hemoglobin: 10.3 g/dL — ABNORMAL LOW (ref 12.0–15.0)
MCH: 27.5 pg (ref 26.0–34.0)
MCHC: 31.4 g/dL (ref 30.0–36.0)
MCV: 87.7 fL (ref 80.0–100.0)
Platelets: 266 10*3/uL (ref 150–400)
RBC: 3.74 MIL/uL — ABNORMAL LOW (ref 3.87–5.11)
RDW: 15.3 % (ref 11.5–15.5)
WBC: 3 10*3/uL — ABNORMAL LOW (ref 4.0–10.5)
nRBC: 0 % (ref 0.0–0.2)

## 2020-03-29 LAB — COMPREHENSIVE METABOLIC PANEL
ALT: 15 U/L (ref 0–44)
AST: 22 U/L (ref 15–41)
Albumin: 3.9 g/dL (ref 3.5–5.0)
Alkaline Phosphatase: 65 U/L (ref 38–126)
Anion gap: 11 (ref 5–15)
BUN: 7 mg/dL (ref 6–20)
CO2: 26 mmol/L (ref 22–32)
Calcium: 9.5 mg/dL (ref 8.9–10.3)
Chloride: 97 mmol/L — ABNORMAL LOW (ref 98–111)
Creatinine, Ser: 0.54 mg/dL (ref 0.44–1.00)
GFR, Estimated: >60 mL/min (ref >60)
Glucose, Bld: 102 mg/dL — ABNORMAL HIGH (ref 70–99)
Potassium: 3.7 mmol/L (ref 3.5–5.1)
Sodium: 134 mmol/L — ABNORMAL LOW (ref 135–145)
Total Bilirubin: 0.9 mg/dL (ref 0.3–1.2)
Total Protein: 7.9 g/dL (ref 6.5–8.1)

## 2020-03-29 LAB — SARS CORONAVIRUS 2 (TAT 6-24 HRS): SARS Coronavirus 2: NEGATIVE

## 2020-03-30 MED ORDER — CEFAZOLIN SODIUM-DEXTROSE 2-4 GM/100ML-% IV SOLN
2.0000 g | INTRAVENOUS | Status: AC
  Administered 2020-03-31 (×2): 2 g via INTRAVENOUS

## 2020-03-30 MED ORDER — ORAL CARE MOUTH RINSE: 15.0000 mL | Freq: Once | OROMUCOSAL | Status: AC

## 2020-03-30 MED ORDER — CHLORHEXIDINE GLUCONATE 0.12 % MT SOLN
15.0000 mL | Freq: Once | OROMUCOSAL | Status: AC
  Administered 2020-03-31: 15 mL via OROMUCOSAL

## 2020-03-30 MED ORDER — POVIDONE-IODINE 10 % EX SWAB
2.0000 "application " | Freq: Once | CUTANEOUS | Status: AC
  Administered 2020-03-31: 2 via TOPICAL

## 2020-03-30 MED ORDER — FAMOTIDINE 20 MG PO TABS
20.0000 mg | ORAL_TABLET | Freq: Once | ORAL | Status: AC
  Administered 2020-03-31: 20 mg via ORAL

## 2020-03-30 MED ORDER — LACTATED RINGERS IV SOLN: INTRAVENOUS | Status: DC

## 2020-03-31 ENCOUNTER — Ambulatory Visit: Payer: BC Managed Care – PPO | Admitting: Urgent Care

## 2020-03-31 ENCOUNTER — Encounter
Admission: RE | Disposition: A | Discharge status: Home / Self Care | Payer: Self-pay | Source: Home / Self Care | Attending: Obstetrics and Gynecology

## 2020-03-31 ENCOUNTER — Ambulatory Visit
Admission: RE | Admit: 2020-03-31 | Discharge: 2020-03-31 | Disposition: A | Discharge status: Home / Self Care | Payer: BC Managed Care – PPO | Attending: Obstetrics and Gynecology | Admitting: Obstetrics and Gynecology

## 2020-03-31 ENCOUNTER — Encounter: Payer: Self-pay | Admitting: Obstetrics and Gynecology

## 2020-03-31 DIAGNOSIS — N840 Polyp of corpus uteri: Secondary | ICD-10-CM | POA: Diagnosis not present

## 2020-03-31 DIAGNOSIS — Z20822 Contact with and (suspected) exposure to covid-19: Secondary | ICD-10-CM | POA: Insufficient documentation

## 2020-03-31 DIAGNOSIS — Z885 Allergy status to narcotic agent status: Secondary | ICD-10-CM | POA: Insufficient documentation

## 2020-03-31 DIAGNOSIS — D500 Iron deficiency anemia secondary to blood loss (chronic): Secondary | ICD-10-CM | POA: Diagnosis not present

## 2020-03-31 DIAGNOSIS — D252 Subserosal leiomyoma of uterus: Secondary | ICD-10-CM | POA: Insufficient documentation

## 2020-03-31 DIAGNOSIS — N920 Excessive and frequent menstruation with regular cycle: Secondary | ICD-10-CM | POA: Insufficient documentation

## 2020-03-31 DIAGNOSIS — D251 Intramural leiomyoma of uterus: Secondary | ICD-10-CM | POA: Diagnosis present

## 2020-03-31 DIAGNOSIS — Z79899 Other long term (current) drug therapy: Secondary | ICD-10-CM | POA: Insufficient documentation

## 2020-03-31 DIAGNOSIS — N800 Endometriosis of uterus: Secondary | ICD-10-CM | POA: Insufficient documentation

## 2020-03-31 DIAGNOSIS — F17290 Nicotine dependence, other tobacco product, uncomplicated: Secondary | ICD-10-CM | POA: Insufficient documentation

## 2020-03-31 HISTORY — DX: Iron deficiency anemia secondary to blood loss (chronic): D50.0

## 2020-03-31 HISTORY — DX: Subserosal leiomyoma of uterus: D25.2

## 2020-03-31 HISTORY — PX: TOTAL LAPAROSCOPIC HYSTERECTOMY WITH SALPINGECTOMY: SHX6742

## 2020-03-31 HISTORY — PX: CYSTOSCOPY: SHX5120

## 2020-03-31 HISTORY — DX: Polyp of corpus uteri: N84.0

## 2020-03-31 HISTORY — DX: Intramural leiomyoma of uterus: D25.1

## 2020-03-31 SURGERY — HYSTERECTOMY, TOTAL, LAPAROSCOPIC, WITH SALPINGECTOMY
Anesthesia: General

## 2020-03-31 MED ORDER — ONDANSETRON HCL 4 MG/2ML IJ SOLN
INTRAMUSCULAR | Status: AC
  Filled 2020-03-31: qty 4

## 2020-03-31 MED ORDER — DEXAMETHASONE SODIUM PHOSPHATE 10 MG/ML IJ SOLN
INTRAMUSCULAR | Status: AC
  Filled 2020-03-31: qty 1

## 2020-03-31 MED ORDER — BUPIVACAINE LIPOSOME 1.3 % IJ SUSP
INTRAMUSCULAR | Status: AC
  Filled 2020-03-31: qty 40

## 2020-03-31 MED ORDER — LIDOCAINE HCL (PF) 2 % IJ SOLN
INTRAMUSCULAR | Status: AC
  Filled 2020-03-31: qty 5

## 2020-03-31 MED ORDER — HEMOSTATIC AGENTS (NO CHARGE) OPTIME
TOPICAL | Status: DC | PRN
  Administered 2020-03-31: 1 via TOPICAL

## 2020-03-31 MED ORDER — ACETAMINOPHEN 10 MG/ML IV SOLN
INTRAVENOUS | Status: AC
  Filled 2020-03-31: qty 100

## 2020-03-31 MED ORDER — SUGAMMADEX SODIUM 500 MG/5ML IV SOLN
INTRAVENOUS | Status: AC
  Filled 2020-03-31: qty 5

## 2020-03-31 MED ORDER — PROPOFOL 10 MG/ML IV BOLUS
INTRAVENOUS | Status: DC | PRN
  Administered 2020-03-31: 150 mg via INTRAVENOUS

## 2020-03-31 MED ORDER — ROCURONIUM BROMIDE 100 MG/10ML IV SOLN
INTRAVENOUS | Status: DC | PRN
  Administered 2020-03-31: 10 mg via INTRAVENOUS
  Administered 2020-03-31: 50 mg via INTRAVENOUS
  Administered 2020-03-31: 20 mg via INTRAVENOUS
  Administered 2020-03-31: 30 mg via INTRAVENOUS

## 2020-03-31 MED ORDER — LIDOCAINE HCL (CARDIAC) PF 100 MG/5ML IV SOSY
PREFILLED_SYRINGE | INTRAVENOUS | Status: DC | PRN
  Administered 2020-03-31: 100 mg via INTRAVENOUS

## 2020-03-31 MED ORDER — FENTANYL CITRATE (PF) 100 MCG/2ML IJ SOLN
INTRAMUSCULAR | Status: AC
  Filled 2020-03-31: qty 2

## 2020-03-31 MED ORDER — BUPIVACAINE HCL (PF) 0.5 % IJ SOLN
INTRAMUSCULAR | Status: AC
  Filled 2020-03-31: qty 60

## 2020-03-31 MED ORDER — FENTANYL CITRATE (PF) 100 MCG/2ML IJ SOLN
INTRAMUSCULAR | Status: DC | PRN
  Administered 2020-03-31 (×3): 100 ug via INTRAVENOUS

## 2020-03-31 MED ORDER — PHENYLEPHRINE HCL (PRESSORS) 10 MG/ML IV SOLN
INTRAVENOUS | Status: AC
  Filled 2020-03-31: qty 1

## 2020-03-31 MED ORDER — KETAMINE HCL 50 MG/ML IJ SOLN
INTRAMUSCULAR | Status: AC
  Filled 2020-03-31: qty 10

## 2020-03-31 MED ORDER — PROPOFOL 10 MG/ML IV BOLUS
INTRAVENOUS | Status: AC
  Filled 2020-03-31: qty 60

## 2020-03-31 MED ORDER — MIDAZOLAM HCL 2 MG/2ML IJ SOLN
INTRAMUSCULAR | Status: DC | PRN
  Administered 2020-03-31: 2 mg via INTRAVENOUS

## 2020-03-31 MED ORDER — PROMETHAZINE HCL 25 MG/ML IJ SOLN: 6.2500 mg | INTRAMUSCULAR | Status: DC | PRN

## 2020-03-31 MED ORDER — FENTANYL CITRATE (PF) 100 MCG/2ML IJ SOLN
INTRAMUSCULAR | Status: AC
  Administered 2020-03-31: 25 ug via INTRAVENOUS
  Filled 2020-03-31: qty 2

## 2020-03-31 MED ORDER — ONDANSETRON HCL 4 MG/2ML IJ SOLN
INTRAMUSCULAR | Status: DC | PRN
  Administered 2020-03-31 (×2): 4 mg via INTRAVENOUS

## 2020-03-31 MED ORDER — DEXAMETHASONE SODIUM PHOSPHATE 10 MG/ML IJ SOLN
INTRAMUSCULAR | Status: DC | PRN
  Administered 2020-03-31: 10 mg via INTRAVENOUS

## 2020-03-31 MED ORDER — ACETAMINOPHEN 10 MG/ML IV SOLN
INTRAVENOUS | Status: DC | PRN
  Administered 2020-03-31: 1000 mg via INTRAVENOUS

## 2020-03-31 MED ORDER — DEXMEDETOMIDINE (PRECEDEX) IN NS 20 MCG/5ML (4 MCG/ML) IV SYRINGE
PREFILLED_SYRINGE | INTRAVENOUS | Status: DC | PRN
  Administered 2020-03-31: 12 ug via INTRAVENOUS
  Administered 2020-03-31 (×2): 8 ug via INTRAVENOUS

## 2020-03-31 MED ORDER — HYDROMORPHONE HCL 2 MG PO TABS
2.0000 mg | ORAL_TABLET | Freq: Four times a day (QID) | ORAL | 0 refills | Status: AC | PRN
Start: 2020-03-31 — End: 2020-04-07

## 2020-03-31 MED ORDER — ROCURONIUM BROMIDE 10 MG/ML (PF) SYRINGE
PREFILLED_SYRINGE | INTRAVENOUS | Status: AC
  Filled 2020-03-31: qty 10

## 2020-03-31 MED ORDER — KETOROLAC TROMETHAMINE 30 MG/ML IJ SOLN
INTRAMUSCULAR | Status: DC | PRN
  Administered 2020-03-31: 30 mg via INTRAVENOUS

## 2020-03-31 MED ORDER — MIDAZOLAM HCL 2 MG/2ML IJ SOLN
INTRAMUSCULAR | Status: AC
  Filled 2020-03-31: qty 2

## 2020-03-31 MED ORDER — DEXMEDETOMIDINE (PRECEDEX) IN NS 20 MCG/5ML (4 MCG/ML) IV SYRINGE
PREFILLED_SYRINGE | INTRAVENOUS | Status: AC
  Filled 2020-03-31: qty 5

## 2020-03-31 MED ORDER — CEFAZOLIN SODIUM-DEXTROSE 2-4 GM/100ML-% IV SOLN
INTRAVENOUS | Status: AC
  Filled 2020-03-31: qty 100

## 2020-03-31 MED ORDER — CEFAZOLIN SODIUM 1 G IJ SOLR
INTRAMUSCULAR | Status: AC
  Filled 2020-03-31: qty 20

## 2020-03-31 MED ORDER — FENTANYL CITRATE (PF) 100 MCG/2ML IJ SOLN
25.0000 ug | INTRAMUSCULAR | Status: DC | PRN
  Administered 2020-03-31: 25 ug via INTRAVENOUS

## 2020-03-31 MED ORDER — GLYCOPYRROLATE 0.2 MG/ML IJ SOLN
INTRAMUSCULAR | Status: AC
  Filled 2020-03-31: qty 1

## 2020-03-31 MED ORDER — ONDANSETRON 4 MG PO TBDP: 4.0000 mg | ORAL_TABLET | Freq: Three times a day (TID) | ORAL | 0 refills | Status: DC | PRN

## 2020-03-31 MED ORDER — SUGAMMADEX SODIUM 500 MG/5ML IV SOLN
INTRAVENOUS | Status: DC | PRN
  Administered 2020-03-31: 400 mg via INTRAVENOUS

## 2020-03-31 MED ORDER — KETAMINE HCL 50 MG/ML IJ SOLN
INTRAMUSCULAR | Status: DC | PRN
  Administered 2020-03-31: 15 mg via INTRAMUSCULAR

## 2020-03-31 MED ORDER — FAMOTIDINE 20 MG PO TABS
ORAL_TABLET | ORAL | Status: AC
  Filled 2020-03-31: qty 1

## 2020-03-31 MED ORDER — BUPIVACAINE LIPOSOME 1.3 % IJ SUSP
INTRAMUSCULAR | Status: DC | PRN
  Administered 2020-03-31: 3 mL
  Administered 2020-03-31: 10 mL

## 2020-03-31 MED ORDER — CHLORHEXIDINE GLUCONATE 0.12 % MT SOLN
OROMUCOSAL | Status: AC
  Filled 2020-03-31: qty 15

## 2020-03-31 MED ORDER — IBUPROFEN 600 MG PO TABS: 600.0000 mg | ORAL_TABLET | Freq: Four times a day (QID) | ORAL | 0 refills | Status: DC

## 2020-03-31 MED ORDER — PHENYLEPHRINE HCL (PRESSORS) 10 MG/ML IV SOLN
INTRAVENOUS | Status: DC | PRN
  Administered 2020-03-31: 100 ug via INTRAVENOUS

## 2020-03-31 MED ORDER — LIDOCAINE HCL (PF) 2 % IJ SOLN
INTRAMUSCULAR | Status: AC
  Filled 2020-03-31: qty 20

## 2020-03-31 MED ORDER — GLYCOPYRROLATE 0.2 MG/ML IJ SOLN
INTRAMUSCULAR | Status: DC | PRN
  Administered 2020-03-31: .2 mg via INTRAVENOUS

## 2020-03-31 SURGICAL SUPPLY — 66 items
ADH SKN CLS APL DERMABOND .7 (GAUZE/BANDAGES/DRESSINGS) ×2
APL PRP STRL LF DISP 70% ISPRP (MISCELLANEOUS) ×4
APL SRG 38 LTWT LNG FL B (MISCELLANEOUS) ×2
APPLICATOR ARISTA FLEXITIP XL (MISCELLANEOUS) ×2 IMPLANT
BAG DRN RND TRDRP ANRFLXCHMBR (UROLOGICAL SUPPLIES) ×2
BAG URINE DRAIN 2000ML AR STRL (UROLOGICAL SUPPLIES) ×4 IMPLANT
BASIN GRAD PLASTIC 32OZ STRL (MISCELLANEOUS) ×2 IMPLANT
BLADE SURG SZ11 CARB STEEL (BLADE) ×6 IMPLANT
CATH FOLEY 2WAY  5CC 16FR (CATHETERS) ×4
CATH FOLEY 2WAY 5CC 16FR (CATHETERS) ×2
CATH URTH 16FR FL 2W BLN LF (CATHETERS) ×2 IMPLANT
CHLORAPREP W/TINT 26 (MISCELLANEOUS) ×6 IMPLANT
COUNTER NEEDLE 20/40 LG (NEEDLE) ×2 IMPLANT
COVER LIGHT HANDLE STERIS (MISCELLANEOUS) ×2 IMPLANT
COVER WAND RF STERILE (DRAPES) ×4 IMPLANT
DEFOGGER SCOPE WARMER CLEARIFY (MISCELLANEOUS) ×4 IMPLANT
DERMABOND ADVANCED (GAUZE/BANDAGES/DRESSINGS) ×2
DERMABOND ADVANCED .7 DNX12 (GAUZE/BANDAGES/DRESSINGS) ×2 IMPLANT
DEVICE SUTURE ENDOST 10MM (ENDOMECHANICALS) ×2 IMPLANT
DRAPE 3/4 80X56 (DRAPES) ×4 IMPLANT
DRAPE CAMERA CLOSED 9X96 (DRAPES) ×2 IMPLANT
DRAPE LEGGINS SURG 28X43 STRL (DRAPES) ×4 IMPLANT
DRAPE STERI POUCH LG 24X46 STR (DRAPES) ×4 IMPLANT
DRAPE UNDER BUTTOCK W/FLU (DRAPES) ×4 IMPLANT
GAUZE 4X4 16PLY RFD (DISPOSABLE) ×6 IMPLANT
GLOVE BIO SURGEON STRL SZ7 (GLOVE) ×28 IMPLANT
GLOVE BIOGEL PI IND STRL 7.5 (GLOVE) ×2 IMPLANT
GLOVE BIOGEL PI INDICATOR 7.5 (GLOVE) ×2
GLOVE INDICATOR 7.5 STRL GRN (GLOVE) ×20 IMPLANT
GOWN STRL REUS W/ TWL LRG LVL3 (GOWN DISPOSABLE) ×12 IMPLANT
GOWN STRL REUS W/ TWL XL LVL3 (GOWN DISPOSABLE) ×2 IMPLANT
GOWN STRL REUS W/TWL LRG LVL3 (GOWN DISPOSABLE) ×24
GOWN STRL REUS W/TWL XL LVL3 (GOWN DISPOSABLE) ×4
GRASPER SUT TROCAR 14GX15 (MISCELLANEOUS) ×4 IMPLANT
HEMOSTAT ARISTA ABSORB 3G PWDR (HEMOSTASIS) ×2 IMPLANT
IRRIGATION STRYKERFLOW (MISCELLANEOUS) IMPLANT
IRRIGATOR STRYKERFLOW (MISCELLANEOUS) ×4
IV LACTATED RINGERS 1000ML (IV SOLUTION) ×8 IMPLANT
KIT PINK PAD W/HEAD ARE REST (MISCELLANEOUS) ×4
KIT PINK PAD W/HEAD ARM REST (MISCELLANEOUS) ×2 IMPLANT
KIT TURNOVER CYSTO (KITS) ×4 IMPLANT
LABEL OR SOLS (LABEL) ×4 IMPLANT
LIGASURE VESSEL 5MM BLUNT TIP (ELECTROSURGICAL) ×2 IMPLANT
MANIFOLD NEPTUNE II (INSTRUMENTS) ×4 IMPLANT
MANIPULATOR VCARE LG CRV RETR (MISCELLANEOUS) ×2 IMPLANT
NEEDLE HYPO 22GX1.5 SAFETY (NEEDLE) ×4 IMPLANT
OCCLUDER COLPOPNEUMO (BALLOONS) ×4 IMPLANT
PACK LAP CHOLECYSTECTOMY (MISCELLANEOUS) ×4 IMPLANT
PAD OB MATERNITY 4.3X12.25 (PERSONAL CARE ITEMS) ×4 IMPLANT
PAD PREP 24X41 OB/GYN DISP (PERSONAL CARE ITEMS) ×4 IMPLANT
SCISSORS METZENBAUM CVD 33 (INSTRUMENTS) ×4 IMPLANT
SET CYSTO W/LG BORE CLAMP LF (SET/KITS/TRAYS/PACK) ×4 IMPLANT
SET TRI-LUMEN FLTR TB AIRSEAL (TUBING) ×4 IMPLANT
SLEEVE ENDOPATH XCEL 5M (ENDOMECHANICALS) ×6 IMPLANT
SOL PREP PVP 2OZ (MISCELLANEOUS) ×4
SOLUTION PREP PVP 2OZ (MISCELLANEOUS) ×2 IMPLANT
SURGILUBE 2OZ TUBE FLIPTOP (MISCELLANEOUS) ×4 IMPLANT
SUT ENDO VLOC 180-0-8IN (SUTURE) ×4 IMPLANT
SUT MNCRL 4-0 (SUTURE) ×4
SUT MNCRL 4-0 27XMFL (SUTURE) ×2
SUT VIC AB 0 CT1 36 (SUTURE) ×4 IMPLANT
SUTURE MNCRL 4-0 27XMF (SUTURE) ×2 IMPLANT
SYR 10ML LL (SYRINGE) ×8 IMPLANT
SYR 50ML LL SCALE MARK (SYRINGE) ×4 IMPLANT
TROCAR PORT AIRSEAL 8X100 (TROCAR) ×4 IMPLANT
TROCAR XCEL NON-BLD 5MMX100MML (ENDOMECHANICALS) ×4 IMPLANT

## 2020-03-31 NOTE — Interval H&P Note (Signed)
History and Physical Interval Note:  03/31/2020 7:23 AM  Catherine Hunter [place] Axe  has presented today for surgery, with the diagnosis of Menorrhagia with regular cycle N92.0 Intramural and submucous leiomyoma of uterus D25.1, D25.2 Iron deficiency anemia due to chronic blood loss D50.0 Endometrial polyp N84.0.  The various methods of treatment have been discussed with the patient and family. After consideration of risks, benefits and other options for treatment, the patient has consented to  Procedure(s): TOTAL LAPAROSCOPIC HYSTERECTOMY WITH BILATERAL SALPINGECTOMY (Bilateral) CYSTOSCOPY (N/A) as a surgical intervention.  The patient's history has been reviewed, patient examined, no change in status, stable for surgery.  I have reviewed the patient's chart and labs.  Questions were answered to the patient's satisfaction.  We have discussed that it may be necessary to perform the surgery as an open surgery meaning a larger incision may need to be made in order to accomplish the surgery safely. She voiced understanding and agrees to proceed.   Prentice Docker, MD, [facility], [facility] 03/31/2020 7:24 AM

## 2020-03-31 NOTE — Anesthesia Preprocedure Evaluation (Signed)
Anesthesia Evaluation  Patient identified by MRN, date of birth, ID band Patient awake    Reviewed: Allergy & Precautions, NPO status , Patient's Chart, lab work & pertinent test results  History of Anesthesia Complications Negative for: history of anesthetic complications  Airway Mallampati: II       Dental  (+) Dental Advidsory Given, Teeth Intact   Pulmonary neg shortness of breath, asthma , neg sleep apnea, neg COPD, neg recent URI, Current Smoker and Patient abstained from smoking.,           Cardiovascular negative cardio ROS       Neuro/Psych negative neurological ROS     GI/Hepatic negative GI ROS, Neg liver ROS,   Endo/Other  negative endocrine ROS  Renal/GU negative Renal ROS     Musculoskeletal   Abdominal   Peds  Hematology  (+) Blood dyscrasia, anemia ,   Anesthesia Other Findings Past Medical History: No date: Anemia No date: Anxiety No date: Asthma No date: Family history of adverse reaction to anesthesia     Comment:  MOTHER PONV   Reproductive/Obstetrics                             Anesthesia Physical  Anesthesia Plan  ASA: II  Anesthesia Plan: General   Post-op Pain Management:    Induction: Intravenous  PONV Risk Score and Plan: 2 and Ondansetron, Dexamethasone, Midazolam, Promethazine and Treatment may vary due to age or medical condition  Airway Management Planned: Oral ETT  Additional Equipment:   Intra-op Plan:   Post-operative Plan: Extubation in OR  Informed Consent: I have reviewed the patients History and Physical, chart, labs and discussed the procedure including the risks, benefits and alternatives for the proposed anesthesia with the patient or authorized representative who has indicated his/her understanding and acceptance.       Plan Discussed with:   Anesthesia Plan Comments:         Anesthesia Quick Evaluation

## 2020-03-31 NOTE — Transfer of Care (Signed)
Immediate Anesthesia Transfer of Care Note  Patient: Catherine Hunter  Procedure(s) Performed: TOTAL LAPAROSCOPIC HYSTERECTOMY WITH BILATERAL SALPINGECTOMY (Bilateral ) CYSTOSCOPY (N/A )  Patient Location: PACU  Anesthesia Type:General  Level of Consciousness: awake, alert , oriented and patient cooperative  Airway & Oxygen Therapy: Patient Spontanous Breathing  Post-op Assessment: Report given to RN and Post -op Vital signs reviewed and stable  Post vital signs: Reviewed and stable  Last Vitals:  Vitals Value Taken Time  BP 123/76 03/31/20 1131  Temp 36.1 C 03/31/20 1130  Pulse 90 03/31/20 1133  Resp 22 03/31/20 1133  SpO2 97 % 03/31/20 1133  Vitals shown include unvalidated device data.  Last Pain:  Vitals:   03/31/20 0633  TempSrc: Oral  PainSc: 0-No pain         Complications: No complications documented.

## 2020-03-31 NOTE — Discharge Instructions (Signed)
AMBULATORY SURGERY  DISCHARGE INSTRUCTIONS   1) The drugs that you were given will stay in your system until tomorrow so for the next 24 hours you should not:  A) Drive an automobile B) Make any legal decisions C) Drink any alcoholic beverage   2) You may resume regular meals tomorrow.  Today it is better to start with liquids and gradually work up to solid foods.  You may eat anything you prefer, but it is better to start with liquids, then soup and crackers, and gradually work up to solid foods.   Please notify your doctor immediately if you have any unusual bleeding, trouble breathing, redness and pain at the surgery site, drainage, fever, or pain not relieved by medication. Total Laparoscopic Hysterectomy, Care After This sheet gives you information about how to care for yourself after your procedure. Your health care provider may also give you more specific instructions. If you have problems or questions, contact your health care provider. What can I expect after the procedure? After the procedure, it is common to have: Pain and bruising around your incisions. A sore throat, if a breathing tube was used during surgery. Fatigue. Poor appetite. Less interest in sex. If your ovaries were also removed, it is also common to have symptoms of menopause such as hot flashes, night sweats, and lack of sleep (insomnia). Follow these instructions at home: Bathing Do not take baths, swim, or use a hot tub until your health care provider approves. You may need to only take showers for 2-3 weeks. Keep your bandage (dressing) dry until your health care provider says it can be removed. Incision care  Follow instructions from your health care provider about how to take care of your incisions. Make sure you: Wash your hands with soap and water before you change your dressing. If soap and water are not available, use hand sanitizer. Change your dressing as told by your health care  provider. Leave stitches (sutures), skin glue, or adhesive strips in place. These skin closures may need to stay in place for 2 weeks or longer. If adhesive strip edges start to loosen and curl up, you may trim the loose edges. Do not remove adhesive strips completely unless your health care provider tells you to do that. Check your incision area every day for signs of infection. Check for: Redness, swelling, or pain. Fluid or blood. Warmth. Pus or a bad smell. Activity Get plenty of rest and sleep. Do not lift anything that is heavier than 10 lbs (4.5 kg) for one month after surgery, or as long as told by your health care provider. Do not drive or use heavy machinery while taking prescription pain medicine. Do not drive for 24 hours if you were given a medicine to help you relax (sedative). Return to your normal activities as told by your health care provider. Ask your health care provider what activities are safe for you. Lifestyle  Do not use any products that contain nicotine or tobacco, such as cigarettes and e-cigarettes. These can delay healing. If you need help quitting, ask your health care provider. Do not drink alcohol until your health care provider approves. General instructions Do not douche, use tampons, or have sex for at least 6 weeks, or as told by your health care provider. Take over-the-counter and prescription medicines only as told by your health care provider. To monitor yourself for a fever, take your temperature at least once a day during recovery. If you struggle with physical or emotional  changes after your procedure, speak with your health care provider or a therapist. To prevent or treat constipation while you are taking prescription pain medicine, your health care provider may recommend that you: Drink enough fluid to keep your urine clear or pale yellow. Take over-the-counter or prescription medicines. Eat foods that are high in fiber, such as fresh fruits and  vegetables, whole grains, and beans. Limit foods that are high in fat and processed sugars, such as fried and sweet foods. Keep all follow-up visits as told by your health care provider. This is important. Contact a health care provider if: You have chills or a fever. You have redness, swelling, or pain around an incision. You have fluid or blood coming from an incision. Your incision feels warm to the touch. You have pus or a bad smell coming from an incision. An incision breaks open. You feel dizzy or light-headed. You have pain or bleeding when you urinate. You have diarrhea, nausea, or vomiting that does not go away. You have abnormal vaginal discharge. You have a rash. You have pain that does not get better with medicine. Get help right away if: You have a fever and your symptoms suddenly get worse. You have severe abdominal pain. You have chest pain. You have shortness of breath. You faint. You have pain, swelling, or redness on your leg. You have heavy vaginal bleeding with blood clots. Summary After the procedure it is common to have abdominal pain. Your provider will give you medication for this. Do not take baths, swim, or use a hot tub until your health care provider approves. Do not lift anything that is heavier than 10 lbs (4.5 kg) for one month after surgery, or as long as told by your health care provider. Notify your provider if you have any signs or symptoms of infection after the procedure. This information is not intended to replace advice given to you by your health care provider. Make sure you discuss any questions you have with your health care provider. Document Revised: 03/29/2017 Document Reviewed: 06/27/2016 Elsevier Patient Education  [place]. 3)   Bupivacaine Liposomal Suspension for Injection What is this medicine? BUPIVACAINE LIPOSOMAL (bue PIV a kane LIP oh som al) is an anesthetic. It causes loss of feeling in the skin or other tissues.  It is used to prevent and to treat pain from some procedures. This medicine may be used for other purposes; ask your health care provider or pharmacist if you have questions. COMMON BRAND NAME(S): EXPAREL What should I tell my health care provider before I take this medicine? They need to know if you have any of these conditions:  G6PD deficiency  heart disease  kidney disease  liver disease  low blood pressure  lung or breathing disease, like asthma  an unusual or allergic reaction to bupivacaine, other medicines, foods, dyes, or preservatives  pregnant or trying to get pregnant  breast-feeding How should I use this medicine? This medicine is for injection into the affected area. It is given by a health care professional in a hospital or clinic setting. Talk to your pediatrician regarding the use of this medicine in children. Special care may be needed. Overdosage: If you think you have taken too much of this medicine contact a poison control center or emergency room at once. NOTE: This medicine is only for you. Do not share this medicine with others. What if I miss a dose? This does not apply. What may interact with this medicine? This  medicine may interact with the following medications:  acetaminophen  certain antibiotics like dapsone, nitrofurantoin, aminosalicylic acid, sulfonamides  certain medicines for seizures like phenobarbital, phenytoin, valproic acid  chloroquine  cyclophosphamide  flutamide  hydroxyurea  ifosfamide  metoclopramide  nitric oxide  nitroglycerin  nitroprusside  nitrous oxide  other local anesthetics like lidocaine, pramoxine, tetracaine  primaquine  quinine  rasburicase  sulfasalazine This list may not describe all possible interactions. Give your health care provider a list of all the medicines, herbs, non-prescription drugs, or dietary supplements you use. Also tell them if you smoke, drink alcohol, or use illegal  drugs. Some items may interact with your medicine. What should I watch for while using this medicine? Your condition will be monitored carefully while you are receiving this medicine. Be careful to avoid injury while the area is numb, and you are not aware of pain. What side effects may I notice from receiving this medicine? Side effects that you should report to your doctor or health care professional as soon as possible:  allergic reactions like skin rash, itching or hives, swelling of the face, lips, or tongue  seizures  signs and symptoms of a dangerous change in heartbeat or heart rhythm like chest pain; dizziness; fast, irregular heartbeat; palpitations; feeling faint or lightheaded; falls; breathing problems  signs and symptoms of methemoglobinemia such as pale, gray, or blue colored skin; headache; fast heartbeat; shortness of breath; feeling faint or lightheaded, falls; tiredness Side effects that usually do not require medical attention (report to your doctor or health care professional if they continue or are bothersome):  anxious  back pain  changes in taste  changes in vision  constipation  dizziness  fever  nausea, vomiting This list may not describe all possible side effects. Call your doctor for medical advice about side effects. You may report side effects to FDA at 1-800-FDA-1088. Where should I keep my medicine? This drug is given in a hospital or clinic and will not be stored at home. NOTE: This sheet is a summary. It may not cover all possible information. If you have questions about this medicine, talk to your doctor, pharmacist, or health care provider.  2020 Elsevier/Gold Standard (2019-01-27 10:48:23)

## 2020-03-31 NOTE — Anesthesia Procedure Notes (Signed)
Procedure Name: Intubation Performed by: Kelton Pillar, CRNA Pre-anesthesia Checklist: Patient identified, Emergency Drugs available, Suction available and Patient being monitored Patient Re-evaluated:Patient Re-evaluated prior to induction Oxygen Delivery Method: Circle system utilized Preoxygenation: Pre-oxygenation with 100% oxygen Induction Type: IV induction Ventilation: Mask ventilation without difficulty Laryngoscope Size: McGraph and 3 Grade View: Grade I Tube type: Oral Tube size: 6.5 mm Number of attempts: 1 Airway Equipment and Method: Stylet and Oral airway Placement Confirmation: ETT inserted through vocal cords under direct vision,  positive ETCO2,  breath sounds checked- equal and bilateral and CO2 detector Secured at: 22 cm Tube secured with: Tape Dental Injury: Teeth and Oropharynx as per pre-operative assessment

## 2020-03-31 NOTE — Op Note (Signed)
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 Total Laparoscopic Hysterectomy, bilateral salpingectomy  2. Cystoscopy  Primary Surgeon: Prentice Docker, MD   Assistant Surgeon: Barnett Applebaum, MD - No other capable assistant available, in surgery requiring high level assistant.  EBL: 100 mL   IVF: 1,400 mL   Urine output: 800 mL  Specimens: Uterus with cervix and bilateral fallopian tubes  Drains: none  Complications: None   Disposition: PACU   Condition: Stable   Findings:  1) Markedly enlarged uterus with multiple fibroids noted 2) Uterus weighed in OR, 505 grams 3) Normal appearing bilateral ovaries 4) On cystoscopy, no evidence of bladder damage. Efflux of urine noted from bilateral ureteral orifices.  Procedure Summary:  The patient was taken to the operating room where general anesthesia was administered and found to be adequate. She was placed in the dorsal supine lithotomy position in [place] stirrups and prepped and draped in usual sterile fashion. After a timeout was called, an indwelling catheter was placed in her bladder. A sterile speculum was placed in the vagina and a single-tooth tenaculum was used to grasp the anterior lip of the cervix. A V-Care uterine manipulator was affixed to the uterus in accordance to the manufacturers recommendations. The speculum and tenaculum was removed from the vagina.  Attention was turned to the abdomen  where, after injection of local anesthetic, a 5 mm [place] incision was made with the scalpel. Entry into the abdomen was obtained via Optiview trocar technique (a blunt entry technique with camera visualization through the obturator upon entry). Verification of entry into the abdomen was obtained using opening pressures. The abdomen was insufflated with CO2. The camera was introduced through the trocar with verification of atraumatic entry. Left and right lower quadrant 5 mm ports were created via direct intra-abdominal camera visualization without difficulty. An 8 mm AirSeal suprapubic port was placed in a similar fashion without difficulty.  After inspection of the abdomen and pelvis with the above-noted findings, the bilateral ureters were identified and found to be well away from the operative area of interest. The left fallopian tube was grasped at the fimbriated end and was transected using the LigaSure along the mesosalpinx in a lateral to medial fashion. The LigaSure then was used to transect the left round ligament and the utero-ovarian ligament was transected. Tissue was divided along the left broad ligament to the level of the interior cervical os. Adhesions from the anterior uterus were taken down with great care and the lower uterine segment was identified. Scar tissue and bladder tissue were dissected off the lower uterine segment and cervix without difficulty. The left uterine artery was skeletonized and identified and after ligation was transected with the LigaSure device. The same procedure was carried out on the right side. The colpotomy was performed using monopolar electrocautery in a circumferential fashion following the KOH ring.    The uterus and fallopian tubes and cervix were removed through the vagina. This portion of the surgery required about 45 minutes.  The cervix was grasped in the vagina and using a bi-valve technique and removal of the fibroids, the uterus, cervix,  fallopian tubes, along with the multiple fibroids were removed from the vagina.    Closure of the vaginal cuff was undertaken using the V-lock stitch in a running fashion. All vascular pedicles were inspected and found to be hemostatic. Copious irrigation was undertaken and hemostasis was again verified. Arista was placed along the vaginal closure to ensure ongoing hemostasis.  Pressure was dropped in the abdomen to 5 mmHg to verify ongoing hemostasis.  The suprapubic trocar was removed and the fascia was reapproximated using #0 Vicryl with a single stitch. The abdomen was then desufflated of CO2 after removal of all instruments. The suprapubic skin incision was closed using 4-0 Vicryl in a subcuticular fashion. The remaining skin incisions were closed using surgical skin glue and a layer of surgical skin glue was placed over suprapubic skin incision, as well.   Cystoscopy was undertaken at this point. The Foley catheter was removed and the 70 cystoscope was gently introduced through the urethra. The bladder survey was undertaken with efflux of urine from both orifices noted. There were no defects noted in the bladder wall. The cystoscope was removed all fluid was drained from the bladder.    The surgical assistant performed creation of a trocar site, retracted tissue, performed ligation and transection of tissue along the right adnexa and performed part of the colpotomy.  The assistant also assisted with closure of the vaginal cuff with retraction of the suture.  He also assisted in the closure of the suprapubic port.    The patient tolerated the procedure well.  Sponge, lap, needle, and instrument counts were correct x 2.  VTE prophylaxis: SCDS. Antibiotic prophylaxis: Ancef 2 gram IV prior to skin incision. She was awakened in the operating room and was taken to the PACU in stable condition. The assistant surgeon was an MD due to lack of availability of another Counselling psychologist.   Prentice Docker,  MD 03/31/2020 11:19 AM

## 2020-04-01 LAB — SURGICAL PATHOLOGY

## 2020-04-02 NOTE — Anesthesia Postprocedure Evaluation (Signed)
Anesthesia Post Note  Patient: Catherine Hunter  Procedure(s) Performed: TOTAL LAPAROSCOPIC HYSTERECTOMY WITH BILATERAL SALPINGECTOMY (Bilateral ) CYSTOSCOPY (N/A )  Patient location during evaluation: PACU Anesthesia Type: General Level of consciousness: awake and alert and oriented Pain management: pain level controlled Vital Signs Assessment: post-procedure vital signs reviewed and stable Respiratory status: spontaneous breathing Cardiovascular status: blood pressure returned to baseline Anesthetic complications: no   No complications documented.   Last Vitals:  Vitals:   03/31/20 1216 03/31/20 1312  BP: 110/62 104/64  Pulse: 82 78  Resp: 16 16  Temp: 37 C 37.3 C  SpO2: 98% 100%    Last Pain:  Vitals:   04/01/20 1159  TempSrc:   PainSc: 4                  Ziah Turvey

## 2020-04-11 ENCOUNTER — Ambulatory Visit (INDEPENDENT_AMBULATORY_CARE_PROVIDER_SITE_OTHER): Payer: BC Managed Care – PPO | Admitting: Obstetrics and Gynecology

## 2020-04-11 ENCOUNTER — Other Ambulatory Visit: Payer: Self-pay

## 2020-04-11 ENCOUNTER — Encounter: Payer: Self-pay | Admitting: Obstetrics and Gynecology

## 2020-04-11 VITALS — BP 126/74 | Wt 163.0 lb

## 2020-04-11 DIAGNOSIS — Z09 Encounter for follow-up examination after completed treatment for conditions other than malignant neoplasm: Secondary | ICD-10-CM

## 2020-04-11 NOTE — Progress Notes (Signed)
   Postoperative Follow-up Patient presents post op from Total Laparoscopic Hysterectomy, bilateral salpingectomy, cystoscopy  11 days ago for menorrhagia with regular cycle, fibroid uterus, endometrial polyp.  Subjective: Patient reports marked improvement in her preop symptoms. Eating a regular diet without difficulty. The patient is not having any pain.  Activity: increasing slowly.  She specifically denies fevers, chills, nausea, vomiting. She is voiding and having bowel movements. Denies vaginal bleeding.  Objective: Vitals:   04/11/20 1038  BP: 126/74   Vital Signs: BP 126/74   Wt 163 lb (73.9 kg)   LMP 03/13/2020 (Approximate)   BMI 30.30 kg/m  Constitutional: Well nourished, well developed female in no acute distress.  HEENT: normal Skin: Warm and dry.  Extremity: no edema  Abdomen: Soft, non-tender, normal bowel sounds; no bruits, organomegaly or masses. clean, dry and intact, no induration, warmth, erythema, tenderness  Pelvic exam: deferred    Assessment: 44 y.o. s/p above surgery progressing well  Plan: Patient has done well after surgery with no apparent complications.  I have discussed the post-operative course to date, and the expected progress moving forward.  The patient understands what complications to be concerned about.  I will see the patient in routine follow up, or sooner if needed.    Activity plan: increase activity slowly. No lifting > 25 pounds until 6 weeks post-op. No intercourse until 8 weeks post-op at least.   Return in about 5 weeks (around 05/16/2020) for Six week post op visit.   Prentice Docker, MD 04/11/2020, 11:25 AM

## 2020-05-17 ENCOUNTER — Ambulatory Visit: Payer: BC Managed Care – PPO | Admitting: Obstetrics and Gynecology

## 2020-11-01 ENCOUNTER — Other Ambulatory Visit: Payer: Self-pay | Admitting: Primary Care

## 2020-11-01 DIAGNOSIS — H538 Other visual disturbances: Secondary | ICD-10-CM

## 2020-11-01 DIAGNOSIS — R519 Headache, unspecified: Secondary | ICD-10-CM

## 2020-11-08 ENCOUNTER — Ambulatory Visit: Payer: BC Managed Care – PPO | Attending: Primary Care

## 2021-01-04 ENCOUNTER — Emergency Department
Admission: EM | Admit: 2021-01-04 | Discharge: 2021-01-04 | Disposition: A | Discharge status: Home / Self Care | Payer: BC Managed Care – PPO | Attending: Emergency Medicine | Admitting: Emergency Medicine

## 2021-01-04 ENCOUNTER — Other Ambulatory Visit: Payer: Self-pay

## 2021-01-04 ENCOUNTER — Encounter: Payer: Self-pay | Admitting: Emergency Medicine

## 2021-01-04 DIAGNOSIS — I10 Essential (primary) hypertension: Secondary | ICD-10-CM | POA: Insufficient documentation

## 2021-01-04 DIAGNOSIS — R519 Headache, unspecified: Secondary | ICD-10-CM | POA: Diagnosis present

## 2021-01-04 DIAGNOSIS — J45909 Unspecified asthma, uncomplicated: Secondary | ICD-10-CM | POA: Insufficient documentation

## 2021-01-04 DIAGNOSIS — R112 Nausea with vomiting, unspecified: Secondary | ICD-10-CM | POA: Insufficient documentation

## 2021-01-04 DIAGNOSIS — Z79899 Other long term (current) drug therapy: Secondary | ICD-10-CM | POA: Insufficient documentation

## 2021-01-04 LAB — COMPREHENSIVE METABOLIC PANEL
ALT: 21 U/L (ref 0–44)
AST: 36 U/L (ref 15–41)
Albumin: 4 g/dL (ref 3.5–5.0)
Alkaline Phosphatase: 58 U/L (ref 38–126)
Anion gap: 12 (ref 5–15)
BUN: 8 mg/dL (ref 6–20)
CO2: 27 mmol/L (ref 22–32)
Calcium: 8.8 mg/dL — ABNORMAL LOW (ref 8.9–10.3)
Chloride: 95 mmol/L — ABNORMAL LOW (ref 98–111)
Creatinine, Ser: 0.4 mg/dL — ABNORMAL LOW (ref 0.44–1.00)
GFR, Estimated: >60 mL/min (ref >60)
Glucose, Bld: 103 mg/dL — ABNORMAL HIGH (ref 70–99)
Potassium: 3.2 mmol/L — ABNORMAL LOW (ref 3.5–5.1)
Sodium: 134 mmol/L — ABNORMAL LOW (ref 135–145)
Total Bilirubin: 0.9 mg/dL (ref 0.3–1.2)
Total Protein: 8.3 g/dL — ABNORMAL HIGH (ref 6.5–8.1)

## 2021-01-04 LAB — CBC
HCT: 39.5 % (ref 36.0–46.0)
Hemoglobin: 13.6 g/dL (ref 12.0–15.0)
MCH: 30.6 pg (ref 26.0–34.0)
MCHC: 34.4 g/dL (ref 30.0–36.0)
MCV: 89 fL (ref 80.0–100.0)
Platelets: 212 10*3/uL (ref 150–400)
RBC: 4.44 MIL/uL (ref 3.87–5.11)
RDW: 15.5 % (ref 11.5–15.5)
WBC: 4.3 10*3/uL (ref 4.0–10.5)
nRBC: 0 % (ref 0.0–0.2)

## 2021-01-04 LAB — LIPASE, BLOOD: Lipase: 43 U/L (ref 11–51)

## 2021-01-04 LAB — TROPONIN I (HIGH SENSITIVITY): Troponin I (High Sensitivity): 6 ng/L (ref <18)

## 2021-01-04 MED ORDER — METOCLOPRAMIDE HCL 10 MG PO TABS
10.0000 mg | ORAL_TABLET | Freq: Once | ORAL | Status: AC
  Administered 2021-01-04: 10 mg via ORAL
  Filled 2021-01-04: qty 1

## 2021-01-04 MED ORDER — POTASSIUM CHLORIDE 20 MEQ PO PACK: 40.0000 meq | PACK | Freq: Two times a day (BID) | ORAL | Status: DC

## 2021-01-04 MED ORDER — ACETAMINOPHEN 500 MG PO TABS
1000.0000 mg | ORAL_TABLET | Freq: Once | ORAL | Status: AC
  Administered 2021-01-04: 1000 mg via ORAL
  Filled 2021-01-04: qty 2

## 2021-01-04 MED ORDER — HYDROCHLOROTHIAZIDE 25 MG PO TABS: 25.0000 mg | ORAL_TABLET | Freq: Every day | ORAL | 0 refills | Status: DC

## 2021-01-04 NOTE — ED Notes (Addendum)
Pt to ED c/o HTN, slight HA and N/V. N/V occurred several times during last couple days (twice today). Pt states could not be pregnant, had hysterectomy. BP today was around 200/100. HA is 3-4/10. Today, pt felt dizzy before she vomitted around 2pm. Then BP was checked and found to be high. Has not eaten much last 2 days, has held down some fluids. Denies diarrhea, fever. EDP at bedside.

## 2021-01-04 NOTE — ED Triage Notes (Signed)
Pt reports that yesterday she did not go to work she was nauseated, she went to work today and vomited and they took her B/P it was elevated. They got 206/104, then did ortho statics. Laying 191/113 Sitting 177/115 standing 175/126. She has a slight headache and does not take anything for HTN

## 2021-01-04 NOTE — ED Notes (Signed)
Drew and sent green top for troponin.

## 2021-01-04 NOTE — Discharge Instructions (Addendum)
Please start taking the hydrochlorothiazide for your blood pressure.  Please follow-up with your primary care provider for repeat blood pressure check and management.

## 2021-01-05 NOTE — ED Provider Notes (Signed)
[facility]  ____________________________________________   Event Date/Time   First MD Initiated Contact with Patient 01/04/21 1737     (approximate)  I have reviewed the triage vital signs and the nursing notes.   HISTORY  Chief Complaint Hypertension and Emesis    HPI Catherine Hunter is a 45 y.o. female mania, anxiety, asthma who presents with nausea and vomiting.  Patient has not felt well for the past 2 days.  Has had some nausea and intermittent emesis.  She denies diarrhea or abdominal pain.  Denies fevers or chills.  Today she was at work when she stood up and felt somewhat lightheaded.  She then had to vomit.  Her coworker took her blood pressure and it was in the [id number] systolic.  She was then advised to go to the emergency department.  Patient does endorse an intermittent right-sided headache that is not associated with visual change neck pain or any numbness or weakness.  She denies prior history of diagnosis of hypertension does not take any medication for hypertension.  She denies chest pain or difficulty breathing.         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   Iron deficiency anemia 10/01/2019   SBO (small bowel obstruction) ([place]) 04/28/2016   Leukopenia 10/06/2015    Past Surgical History:  Procedure Laterality Date   APPENDECTOMY     AT SAME TIME AS COLON [place] N/A 03/31/2020   Procedure: CYSTOSCOPY;  Surgeon: Will Bonnet, MD;  Location: [facility] ORS;  Service: Gynecology;  Laterality: N/A;    ECTOPIC PREGNANCY SURGERY  2000   x2   HERNIA REPAIR     LAPAROSCOPIC GASTRIC SLEEVE RESECTION  07/20/2014   LAPAROTOMY N/A 04/29/2016   Procedure: EXPLORATORY LAPAROTOMY and appendectomy;  Surgeon: Florene Glen, MD;  Location: [facility] ORS;  Service: General;  Laterality: N/A;   TOTAL LAPAROSCOPIC HYSTERECTOMY WITH SALPINGECTOMY Bilateral 03/31/2020   Procedure: TOTAL LAPAROSCOPIC HYSTERECTOMY WITH BILATERAL SALPINGECTOMY;  Surgeon: Will Bonnet, MD;  Location: [facility] ORS;  Service: Gynecology;  Laterality: Bilateral;    Prior to Admission medications   Medication Sig Start Date End Date Taking? Authorizing Provider  hydrochlorothiazide (HYDRODIURIL) 25 MG tablet Take 1 tablet (25 mg total) by mouth daily. 01/04/21 02/03/21 Yes Rada Hay, MD  albuterol (VENTOLIN HFA) 108 (90 Base) MCG/ACT inhaler Inhale 1 puff into the lungs every 6 (six) hours as needed for wheezing or shortness of breath.  09/18/19   [provider]  ferrous sulfate (FERROUSUL) 325 (65 FE) MG tablet Take 1 tablet (325 mg total) by mouth daily with breakfast. 11/26/19   Lloyd Huger, MD  ibuprofen (ADVIL) 600 MG tablet Take 1 tablet (600 mg total) by mouth every 6 (six) hours. 03/31/20   Will Bonnet, MD  Multiple Vitamins-Minerals (MULTIVITAMIN WITH MINERALS) tablet Take 1 tablet by mouth 4 (four) times a week.     [provider]  ondansetron (ZOFRAN ODT) 4 MG disintegrating  tablet Take 1 tablet (4 mg total) by mouth every 8 (eight) hours as needed for nausea or vomiting. 03/31/20   Will Bonnet, MD    Allergies Hydrocodone and Oxycodone  Family History  Problem Relation Age of Onset   Hypertension Mother    Hypertension Father    Throat cancer Father    Heart disease Father    Hypertension Sister    Hypertension Sister    Uterine cancer Neg Hx    Ovarian cancer Neg Hx     Social History Social History   Tobacco Use   Smoking status: Some Days    Packs/day: 0.25     Years: 4.00    Pack years: 1.00    Types: Cigars, Cigarettes   Smokeless tobacco: Never  Vaping Use   Vaping Use: Never used  Substance Use Topics   Alcohol use: Yes    Comment: 2 drinks wine/ weekly   Drug use: No    Review of Systems   Review of Systems  Constitutional:  Negative for chills and fatigue.  Eyes:  Negative for visual disturbance.  Respiratory:  Negative for shortness of breath.   Cardiovascular:  Negative for chest pain.  Gastrointestinal:  Positive for nausea and vomiting. Negative for abdominal pain.  Genitourinary:  Negative for dysuria.  Neurological:  Positive for headaches. Negative for weakness and numbness.  All other systems reviewed and are negative.  Physical Exam Updated Vital Signs BP (!) 145/92   Pulse 69   Temp 98.7 F (37.1 C) (Oral)   Resp 20   Ht '5\' 2"'$  (1.575 m)   Wt 73.5 kg   SpO2 99%   BMI 29.63 kg/m   Physical Exam Vitals and nursing note reviewed.  Constitutional:      General: She is not in acute distress.    Appearance: Normal appearance.  HENT:     Head: Normocephalic and atraumatic.  Eyes:     General: No scleral icterus.    Conjunctiva/sclera: Conjunctivae normal.  Pulmonary:     Effort: Pulmonary effort is normal. No respiratory distress.     Breath sounds: No stridor.  Musculoskeletal:        General: No deformity or signs of injury.     Cervical back: Normal range of motion.  Skin:    General: Skin is dry.     Coloration: Skin is not jaundiced or pale.  Neurological:     General: No focal deficit present.     Mental Status: She is alert and oriented to person, place, and time. Mental status is at baseline.     Comments: Pupils equal round and reactive, extraocular movements intact, face is symmetric 5 out of 5 strength in the bilateral upper and lower extremities Sensation grossly intact in the bilateral upper and lower extremities Finger-nose-finger intact bilaterally  Psychiatric:        Mood and Affect:  Mood normal.        Behavior: Behavior normal.     LABS (all labs ordered are listed, but only abnormal results are displayed)  Labs Reviewed  COMPREHENSIVE METABOLIC PANEL - Abnormal; Notable for the following components:      Result Value   Sodium 134 (*)    Potassium 3.2 (*)    Chloride 95 (*)    Glucose, Bld 103 (*)    Creatinine, Ser 0.40 (*)    Calcium 8.8 (*)    Total Protein 8.3 (*)    All other components within normal limits  LIPASE, BLOOD  CBC  POC URINE PREG, ED  TROPONIN I (HIGH SENSITIVITY)   ____________________________________________  EKG  Normal sinus rhythm, normal axis, normal intervals, no acute ischemic changes ____________________________________________  RADIOLOGY I, Madelin Headings, personally viewed and evaluated these images (plain radiographs) as part of my medical decision making, as well as reviewing the written report by the radiologist.  ED MD interpretation:    N/a  ____________________________________________   PROCEDURES  Procedure(s) performed (including Critical Care):  Procedures   ____________________________________________   INITIAL IMPRESSION / ASSESSMENT AND PLAN / ED COURSE  45 year old female presents with nausea vomiting and a mild headache.  Headache is not worst of life, gradual in onset and intermittent.  Not associated visual changes numbness or weakness.  Low suspicion for subarachnoid. She has had nausea and vomiting for 2 days, abdomen is benign, tolerating p.o. currently.  I suspect she may have a viral syndrome.  Patient works for primary care office and her blood pressure was taken today which was high and is the reason she was told to come to the ED.  Blood pressure is elevated today.  However I am not concerned for hypertensive emergency as she has no signs or symptoms of endorgan damage.  Screening labs obtained which are reassuring including troponin.  Her EKG is negative for ischemia.  Given her  persistent hypertension in the ED I did start her on hydrochlorothiazide.  She was given Tylenol and Reglan in the ED for headache.  I advised that she follow-up with her primary care provider for management of her blood pressure.     Clinical Course as of 01/05/21 0008  Wed Jan 04, 2021  1858 Troponin I (High Sensitivity): 6 [KM]    Clinical Course User Index [KM] Rada Hay, MD     ____________________________________________   FINAL CLINICAL IMPRESSION(S) / ED DIAGNOSES  Final diagnoses:  Hypertension, unspecified type  Nausea and vomiting, intractability of vomiting not specified, unspecified vomiting type     ED Discharge Orders          Ordered    hydrochlorothiazide (HYDRODIURIL) 25 MG tablet  Daily        01/04/21 1902             Note:  This document was prepared using Dragon voice recognition software and may include unintentional dictation errors.    Rada Hay, MD 01/05/21 [phone]

## 2021-02-21 ENCOUNTER — Encounter: Payer: Self-pay | Admitting: Emergency Medicine

## 2021-02-21 ENCOUNTER — Other Ambulatory Visit: Payer: Self-pay

## 2021-02-21 ENCOUNTER — Ambulatory Visit
Admission: EM | Admit: 2021-02-21 | Discharge: 2021-02-21 | Disposition: A | Discharge status: Home / Self Care | Payer: BC Managed Care – PPO | Attending: Emergency Medicine | Admitting: Emergency Medicine

## 2021-02-21 DIAGNOSIS — R112 Nausea with vomiting, unspecified: Secondary | ICD-10-CM | POA: Diagnosis not present

## 2021-02-21 DIAGNOSIS — R197 Diarrhea, unspecified: Secondary | ICD-10-CM | POA: Diagnosis not present

## 2021-02-21 DIAGNOSIS — Z20822 Contact with and (suspected) exposure to covid-19: Secondary | ICD-10-CM | POA: Diagnosis not present

## 2021-02-21 DIAGNOSIS — I10 Essential (primary) hypertension: Secondary | ICD-10-CM | POA: Diagnosis not present

## 2021-02-21 MED ORDER — ONDANSETRON 8 MG PO TBDP: 8.0000 mg | ORAL_TABLET | Freq: Three times a day (TID) | ORAL | 0 refills | Status: AC | PRN

## 2021-02-21 MED ORDER — AMLODIPINE BESYLATE 5 MG PO TABS: 5.0000 mg | ORAL_TABLET | Freq: Every day | ORAL | 0 refills | Status: AC

## 2021-02-21 NOTE — Discharge Instructions (Addendum)
Try the Zofran and pushing electrolyte containing fluids, such as Pedialyte or Gatorade.  If you are still having nausea, vomiting, diarrhea by tomorrow morning despite the Zofran, given go to the ED for IV fluids and labs.  Go to the ER sooner if you get worse in the meantime.  I am starting you on amlodipine for your blood pressure.  It is important that she start it.  Decrease your salt intake. diet and exercise will lower your blood pressure significantly. It is important to keep your blood pressure under good control, as having a elevated blood pressure for prolonged periods of time significantly increases your risk of stroke, heart attacks, kidney damage, eye damage, and other problems. Measure your blood pressure once a day, preferably at the same time every day. Keep a log of this and bring it to your next doctor's appointment.  Bring your blood pressure cuff as well.  Return here in 2 weeks for blood pressure recheck if you're unable to find a primary care physician by then. Return immediately to the ER if you start having chest pain, headache, problems seeing, problems talking, problems walking, if you feel like you're about to pass out, if you do pass out, if you have a seizure, or for any other concerns.  Go to www.goodrx.com  or www.costplusdrugs.com to look up your medications. This will give you a list of where you can find your prescriptions at the most affordable prices. Or ask the pharmacist what the cash price is, or if they have any other discount programs available to help make your medication more affordable. This can be less expensive than what you would pay with insurance.

## 2021-02-21 NOTE — ED Triage Notes (Signed)
Diarrhea, vomiting, nausea since yesterday.  Nose bleed this morning.

## 2021-02-21 NOTE — ED Provider Notes (Signed)
HPI  SUBJECTIVE:  Catherine Hunter is a 45 y.o. female who presents with 3 days of nausea, vomiting, diarrhea, burning nonmigratory, nonradiating constant epigastric pain accompanied with anorexia.  States that she has had 4 episodes of emesis and is having 4-5 episodes of watery, nonbloody diarrhea per day.  She reports headaches, decreased urine output.  No fevers, body aches, loss of sense or smell or taste, sore throat, cough, abdominal distention, hematemesis, lightheadedness, dizziness.  No COVID or flu exposure.  She got the second dose of the COVID-vaccine.  She did not get  this years flu vaccine.  Denies hematemesis.  States that she is unable to tolerate any p.o., she states that she either vomits or has diarrhea.  She has tried fluids and broths without improvement in her symptoms.  Symptoms are worse with p.o. intake.  No recent travel, raw or undercooked food, questionable leftovers, sick contacts.  The car ride over here was not painful.  No antipyretic in the past 6 hours.  She states that her blood pressure has been running in the 170s/90s at home recently.  She has a past medical history of gastric sleeve, small bowel obstruction asthma, COVID x2, status post appendectomy, hernia repair, salpingectomy due to ectopic pregnancy x2.  No diagnosis of hypertension, coronary artery disease, seizures, MI, chronic kidney disease.  PMD: None.    Past Medical History:  Diagnosis Date   Anemia    Anxiety    Asthma    Family history of adverse reaction to anesthesia    MOTHER PONV    Past Surgical History:  Procedure Laterality Date   APPENDECTOMY     AT SAME TIME AS COLON [place] N/A 03/31/2020   Procedure: CYSTOSCOPY;  Surgeon: Will Bonnet, MD;  Location: [facility] ORS;  Service: Gynecology;  Laterality: N/A;   ECTOPIC PREGNANCY SURGERY  2000   x2   HERNIA REPAIR     LAPAROSCOPIC GASTRIC SLEEVE RESECTION   07/20/2014   LAPAROTOMY N/A 04/29/2016   Procedure: EXPLORATORY LAPAROTOMY and appendectomy;  Surgeon: Florene Glen, MD;  Location: [facility] ORS;  Service: General;  Laterality: N/A;   TOTAL LAPAROSCOPIC HYSTERECTOMY WITH SALPINGECTOMY Bilateral 03/31/2020   Procedure: TOTAL LAPAROSCOPIC HYSTERECTOMY WITH BILATERAL SALPINGECTOMY;  Surgeon: Will Bonnet, MD;  Location: [facility] ORS;  Service: Gynecology;  Laterality: Bilateral;    Family History  Problem Relation Age of Onset   Hypertension Mother    Hypertension Father    Throat cancer Father    Heart disease Father    Hypertension Sister    Hypertension Sister    Uterine cancer Neg Hx    Ovarian cancer Neg Hx     Social History   Tobacco Use   Smoking status: Some Days    Packs/day: 0.25    Years: 4.00    Pack years: 1.00    Types: Cigars, Cigarettes   Smokeless tobacco: Never  Vaping Use   Vaping Use: Never used  Substance Use Topics   Alcohol use: Yes    Comment: 2 drinks wine/ weekly   Drug use: No    No current facility-administered medications for this encounter.  Current Outpatient Medications:    albuterol (VENTOLIN HFA) 108 (90 Base) MCG/ACT inhaler, Inhale 1 puff into the lungs every 6 (six) hours as needed for wheezing or shortness of breath. , Disp: , Rfl:    ferrous sulfate (  FERROUSUL) 325 (65 FE) MG tablet, Take 1 tablet (325 mg total) by mouth daily with breakfast., Disp: 30 tablet, Rfl: 1   hydrochlorothiazide (HYDRODIURIL) 25 MG tablet, Take 1 tablet (25 mg total) by mouth daily., Disp: 30 tablet, Rfl: 0   ibuprofen (ADVIL) 600 MG tablet, Take 1 tablet (600 mg total) by mouth every 6 (six) hours., Disp: 30 tablet, Rfl: 0   Multiple Vitamins-Minerals (MULTIVITAMIN WITH MINERALS) tablet, Take 1 tablet by mouth 4 (four) times a week. , Disp: , Rfl:    ondansetron (ZOFRAN ODT) 4 MG disintegrating tablet, Take 1 tablet (4 mg total) by mouth every 8 (eight) hours as needed for nausea or vomiting., Disp: 20  tablet, Rfl: 0  Allergies  Allergen Reactions   Hydrocodone Hives and Itching   Oxycodone Hives and Itching     ROS  As noted in HPI.   Physical Exam  BP (!) 201/108 (BP Location: Right Arm)   Pulse 86   Temp 98.5 F (36.9 C) (Oral)   Resp 18   LMP 03/13/2020 (Approximate)   SpO2 98%   Constitutional: Well developed, well nourished, no acute distress Eyes:  EOMI, conjunctiva normal bilaterally HENT: Normocephalic, atraumatic, Respiratory: Normal inspiratory effort, lungs clear bilaterally. Cardiovascular: Normal rate, regular rhythm, no murmurs rubs or gallops.  Cap refill less than 2 seconds. GI: Multiple laparoscopic surgical scars.  Soft, nontender, nondistended, no guarding, rebound.  Active bowel sounds.  Negative Murphy. Back: No CVAT skin: No rash, skin intact Musculoskeletal: no deformities.  No lower extremity edema. Neurologic: Alert & oriented x 3, no focal neuro deficits Psychiatric: Speech and behavior appropriate   ED Course   Medications - No data to display  Orders Placed This Encounter  Procedures   Covid-19, Flu A+B ([facility])    Standing Status:   Standing    Number of Occurrences:   1    No results found for this or any previous visit (from the past 24 hour(s)). No results found.  ED Clinical Impression  1. Exposure to COVID-19 virus      ED Assessment/Plan  1.  Nausea, vomiting, diarrhea-COVID, flu sent.  She will be a candidate for Molnupiravir based on hypertension.  She is out of the treatment window for flu.  Concerned that the patient has an electrolyte imbalance/is somewhat dehydrated, even that the capillary refill is less than 2 seconds.  Discussed with her that I would like for her to go to the ED for labs and IV fluids, especially with her blood pressure being as high as it is.  Patient does not want to go.  Explained rationale of why I think she needs to go.  She wants to try going home with Zofran and will attempt oral  rehydration at home.  If she is not better by tomorrow morning, she is to go immediately to the emergency department.  COVID, flu pending at the time of signing of this note.  2.  Hypertension.  Patient denies hypertensive emergency symptoms. States BP has been running in 170s/90s recently.  Pt has no historical evidence of end organ damage. Pt denies any CNS type sx such as HA, acute visual changes, focal paresis, or new onset seizure activity. Pt denies any CV sx such as CP, dyspnea, palpitations, pedal edema, tearing pain radiating to back or abd. Pt denied any renal sx such as anuria or hematuria. Pt denies  recent use of OTC medications such as nasal decongestants.  She has a blood pressure cuff  at home.  We will have her keep an eye on her blood pressure, keep a log of this.  Starting her on amlodipine 5 mg Daily.   She is to follow-up with a primary care provider or here within 2 weeks for repeat blood pressure check.  She will bring her blood pressure cuff and blood pressure log with her. Gave her strict ER return precautions.  EKG: Normal sinus rhythm, rate 70.  Normal axis, normal intervals.  No hypertrophy.  No ST-T wave changes compared to EKG from 01/04/2021.  Per previous records, she was prescribed hydrochlorothiazide in the past for hypertension, but did not fill it as she was not sure if she needed it.  Discussed  MDM, treatment plan, and plan for follow-up with patient. Discussed sn/sx that should prompt return to the ED. patient agrees with plan.   No orders of the defined types were placed in this encounter.     *This clinic note was created using Dragon dictation software. Therefore, there may be occasional mistakes despite careful proofreading.  ?    Melynda Ripple, MD 02/22/21 [phone]

## 2021-02-22 LAB — COVID-19, FLU A+B NAA
Influenza A, NAA: NOT DETECTED
Influenza B, NAA: NOT DETECTED
SARS-CoV-2, NAA: NOT DETECTED

## 2021-03-29 ENCOUNTER — Other Ambulatory Visit: Payer: Self-pay | Admitting: Primary Care

## 2021-03-29 ENCOUNTER — Other Ambulatory Visit: Payer: Self-pay | Admitting: Family Medicine

## 2021-03-29 DIAGNOSIS — R519 Headache, unspecified: Secondary | ICD-10-CM

## 2021-03-29 DIAGNOSIS — H538 Other visual disturbances: Secondary | ICD-10-CM

## 2021-04-07 ENCOUNTER — Encounter: Payer: Self-pay | Admitting: Oncology

## 2021-04-10 ENCOUNTER — Other Ambulatory Visit: Payer: Self-pay

## 2021-04-10 ENCOUNTER — Ambulatory Visit (INDEPENDENT_AMBULATORY_CARE_PROVIDER_SITE_OTHER): Payer: BC Managed Care – PPO | Admitting: Internal Medicine

## 2021-04-10 ENCOUNTER — Encounter: Payer: Self-pay | Admitting: Internal Medicine

## 2021-04-10 VITALS — BP 168/98 | HR 81 | Ht 62.0 in | Wt 166.0 lb

## 2021-04-10 DIAGNOSIS — I10 Essential (primary) hypertension: Secondary | ICD-10-CM | POA: Insufficient documentation

## 2021-04-10 DIAGNOSIS — Z903 Acquired absence of stomach [part of]: Secondary | ICD-10-CM | POA: Diagnosis not present

## 2021-04-10 DIAGNOSIS — Z720 Tobacco use: Secondary | ICD-10-CM | POA: Insufficient documentation

## 2021-04-10 DIAGNOSIS — D500 Iron deficiency anemia secondary to blood loss (chronic): Secondary | ICD-10-CM | POA: Diagnosis not present

## 2021-04-10 DIAGNOSIS — F418 Other specified anxiety disorders: Secondary | ICD-10-CM | POA: Insufficient documentation

## 2021-04-10 MED ORDER — TELMISARTAN 20 MG PO TABS: 20.0000 mg | ORAL_TABLET | Freq: Every day | ORAL | 1 refills | Status: AC

## 2021-04-10 MED ORDER — HYDROXYZINE PAMOATE 25 MG PO CAPS: 25.0000 mg | ORAL_CAPSULE | Freq: Three times a day (TID) | ORAL | 0 refills | Status: AC | PRN

## 2021-04-10 MED ORDER — FLUOXETINE HCL 10 MG PO TABS
10.0000 mg | ORAL_TABLET | Freq: Every day | ORAL | 2 refills | Status: AC
Start: 2021-04-10 — End: ?

## 2021-04-10 NOTE — Assessment & Plan Note (Signed)
Takes multivitamin

## 2021-04-10 NOTE — Assessment & Plan Note (Signed)
Uncontrolled Did not tolerate Zoloft and Wellbutrin in the past Started Prozac and as needed Vistaril Offered BH therapy, she wants to wait for now.

## 2021-04-10 NOTE — Progress Notes (Signed)
New Patient Office Visit  Subjective:  Patient ID: Catherine Hunter, female    DOB: Sep 30, 1975  Age: 45 y.o. MRN: [id number]  CC:  Chief Complaint  Patient presents with   Establish Care    PT would like to discuss her anxiety and other things    HPI Catherine Hunter is a 45 y.o. female with past medical history of HTN, depression with anxiety and obesity who presents for establishing care.  HTN: She was recently started on amlodipine for HTN.  Her BP was elevated in the office today upon multiple measurements.  She has had intermittent headache, dizziness and blurred vision.  She denies any chest pain, dyspnea or palpitations.  She has been feeling anxious lately, especially at the end of her work shift.  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  She has to drive about an hour to get home.  She reports that she gets anxious while driving back to home, especially in the winter months when it is dark.  She has anhedonia and chronic insomnia.  Denies any recent change in weight or appetite. She denies any SI or HI currently.  She has had a gastric sleeve surgery.  She has had hysterectomy.  She currently takes iron supplement and multivitamin.  She has had COVID and flu vaccines.  She wants to think for Cologuard versus colonoscopy for now.   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   SBO (small bowel obstruction) ([place]) 04/28/2016    Past Surgical History:  Procedure Laterality Date   APPENDECTOMY     AT SAME TIME AS COLON SURGERY   CESAREAN SECTION  1997   COLON SURGERY     BOWEL BLOCKAGE   CYSTOSCOPY N/A 03/31/2020   Procedure: CYSTOSCOPY;  Surgeon: Will Bonnet, MD;  Location: [facility] ORS;  Service: Gynecology;   Laterality: N/A;   ECTOPIC PREGNANCY SURGERY  2000   x2   HERNIA REPAIR     LAPAROSCOPIC GASTRIC SLEEVE RESECTION  07/20/2014   LAPAROTOMY N/A 04/29/2016   Procedure: EXPLORATORY LAPAROTOMY and appendectomy;  Surgeon: Florene Glen, MD;  Location: [facility] ORS;  Service: General;  Laterality: N/A;   TOTAL LAPAROSCOPIC HYSTERECTOMY WITH SALPINGECTOMY Bilateral 03/31/2020   Procedure: TOTAL LAPAROSCOPIC HYSTERECTOMY WITH BILATERAL SALPINGECTOMY;  Surgeon: Will Bonnet, MD;  Location: [facility];  Service: Gynecology;  Laterality: Bilateral;    Family History  Problem Relation Age of Onset   Hypertension Mother    Hypertension Father    Throat cancer Father    Heart disease Father    Hypertension Sister    Hypertension Sister    Uterine cancer Neg Hx    Ovarian cancer Neg Hx     Social History   Socioeconomic History   Marital status: Married    Spouse name: Not on file   Number of children: Not on file   Years of education: Not on file   Highest education level: Not on file  Occupational History   Not on file  Tobacco Use   Smoking status: Some Days    Packs/day: 0.25    Years: 4.00    Pack years: 1.00    Types: Cigars,  Cigarettes   Smokeless tobacco: Never  Vaping Use   Vaping Use: Never used  Substance and Sexual Activity   Alcohol use: Yes    Comment: 2 drinks wine/ weekly   Drug use: No   Sexual activity: Yes    Birth control/protection: Abstinence  Other Topics Concern   Not on file  Social History Narrative   Not on file   Social Determinants of Health   Financial Resource Strain: Not on file  Food Insecurity: Not on file  Transportation Needs: Not on file  Physical Activity: Not on file  Stress: Not on file  Social Connections: Not on file  Intimate Partner Violence: Not on file    ROS Review of Systems  Constitutional:  Negative for chills and fever.  HENT:  Negative for congestion, sinus pressure, sinus pain and sore throat.   Eyes:   Positive for visual disturbance. Negative for pain and discharge.  Respiratory:  Negative for cough and shortness of breath.   Cardiovascular:  Negative for chest pain and palpitations.  Gastrointestinal:  Negative for abdominal pain, diarrhea, nausea and vomiting.  Endocrine: Negative for polydipsia and polyuria.  Genitourinary:  Negative for dysuria and hematuria.  Musculoskeletal:  Negative for neck pain and neck stiffness.  Skin:  Negative for rash.  Neurological:  Positive for dizziness. Negative for weakness.  Psychiatric/Behavioral:  Positive for dysphoric mood. Negative for agitation and behavioral problems. The patient is nervous/anxious.    Objective:   Today's Vitals: BP (!) 168/98 (BP Location: Right Arm)   Pulse 81   Ht 5\' 2"  (1.575 m)   Wt 166 lb (75.3 kg)   LMP 03/13/2020 (Approximate)   SpO2 97%   BMI 30.36 kg/m   Physical Exam Vitals reviewed.  Constitutional:      General: She is not in acute distress.    Appearance: She is not diaphoretic.  HENT:     Head: Normocephalic and atraumatic.     Mouth/Throat:     Mouth: Mucous membranes are moist.  Eyes:     General: No scleral icterus.    Extraocular Movements: Extraocular movements intact.  Cardiovascular:     Rate and Rhythm: Normal rate and regular rhythm.     Pulses: Normal pulses.     Heart sounds: Normal heart sounds. No murmur heard. Pulmonary:     Breath sounds: Normal breath sounds. No wheezing or rales.  Musculoskeletal:     Cervical back: Neck supple. No tenderness.     Right lower leg: No edema.     Left lower leg: No edema.  Skin:    General: Skin is warm.     Findings: No rash.  Neurological:     General: No focal deficit present.     Mental Status: She is alert and oriented to person, place, and time.     Sensory: No sensory deficit.     Motor: No weakness.  Psychiatric:        Mood and Affect: Mood normal.        Behavior: Behavior normal.    Assessment & Plan:   Problem List  Items Addressed This Visit       Cardiovascular and Mediastinum   Essential hypertension - Primary    BP Readings from Last 1 Encounters:  04/10/21 (!) 168/98  uncontrolled with Amlodipine 5 mg QD Added Telmisartan 20 mg QD Counseled for compliance with the medications Advised DASH diet and moderate exercise/walking, at least 150 mins/week  Relevant Medications   telmisartan (MICARDIS) 20 MG tablet     Other   Iron deficiency anemia    On iron supplements      S/P gastric sleeve procedure    Takes multivitamin      Depression with anxiety    Uncontrolled Did not tolerate Zoloft and Wellbutrin in the past Started Prozac and as needed Vistaril Offered BH therapy, she wants to wait for now.       Relevant Medications   FLUoxetine (PROZAC) 10 MG tablet   hydrOXYzine (VISTARIL) 25 MG capsule   Tobacco abuse    Smokes sporadically  Asked about quitting: confirms that she currently smokes cigarettes Advise to quit smoking: Educated about QUITTING to reduce the risk of cancer, cardio and cerebrovascular disease. Assess willingness: Unwilling to quit at this time, but is working on cutting back. Assist with counseling and pharmacotherapy: Counseled for 5 minutes and literature provided. Arrange for follow up: follow up in 3 months and continue to offer help.       Outpatient Encounter Medications as of 04/10/2021  Medication Sig   albuterol (VENTOLIN HFA) 108 (90 Base) MCG/ACT inhaler Inhale 1 puff into the lungs every 6 (six) hours as needed for wheezing or shortness of breath.    amLODipine (NORVASC) 5 MG tablet Take 1 tablet (5 mg total) by mouth daily.   ferrous sulfate (FERROUSUL) 325 (65 FE) MG tablet Take 1 tablet (325 mg total) by mouth daily with breakfast.   FLUoxetine (PROZAC) 10 MG tablet Take 1 tablet (10 mg total) by mouth daily.   hydrOXYzine (VISTARIL) 25 MG capsule Take 1 capsule (25 mg total) by mouth every 8 (eight) hours as needed.   Multiple  Vitamins-Minerals (MULTIVITAMIN WITH MINERALS) tablet Take 1 tablet by mouth 4 (four) times a week.    ondansetron (ZOFRAN ODT) 8 MG disintegrating tablet Take 1 tablet (8 mg total) by mouth every 8 (eight) hours as needed for nausea or vomiting.   telmisartan (MICARDIS) 20 MG tablet Take 1 tablet (20 mg total) by mouth daily.   No facility-administered encounter medications on file as of 04/10/2021.    Follow-up: Return in about 6 weeks (around 05/22/2021) for HTN.   Lindell Spar, MD

## 2021-04-10 NOTE — Patient Instructions (Signed)
Please start taking Telmisartan in addition to Amlodipine for HTN.  Please follow DASH diet and perform moderate exercise/walking at least 150 mins/week.  Please start taking Prozac and as needed Vistaril for anxiety.

## 2021-04-10 NOTE — Assessment & Plan Note (Signed)
Smokes sporadically  Asked about quitting: confirms that she currently smokes cigarettes Advise to quit smoking: Educated about QUITTING to reduce the risk of cancer, cardio and cerebrovascular disease. Assess willingness: Unwilling to quit at this time, but is working on cutting back. Assist with counseling and pharmacotherapy: Counseled for 5 minutes and literature provided. Arrange for follow up: follow up in 3 months and continue to offer help.

## 2021-04-10 NOTE — Assessment & Plan Note (Signed)
On iron supplements 

## 2021-04-10 NOTE — Assessment & Plan Note (Addendum)
BP Readings from Last 1 Encounters:  04/10/21 (!) 168/98   uncontrolled with Amlodipine 5 mg QD Added Telmisartan 20 mg QD Counseled for compliance with the medications Advised DASH diet and moderate exercise/walking, at least 150 mins/week

## 2021-04-19 ENCOUNTER — Ambulatory Visit: Payer: BC Managed Care – PPO

## 2021-05-22 ENCOUNTER — Ambulatory Visit: Payer: BC Managed Care – PPO | Admitting: Internal Medicine

## 2021-07-18 ENCOUNTER — Encounter: Payer: Self-pay | Admitting: Emergency Medicine

## 2021-07-18 ENCOUNTER — Ambulatory Visit
Admission: EM | Admit: 2021-07-18 | Discharge: 2021-07-18 | Disposition: A | Discharge status: Home / Self Care | Payer: BC Managed Care – PPO | Attending: Family Medicine | Admitting: Family Medicine

## 2021-07-18 ENCOUNTER — Ambulatory Visit (INDEPENDENT_AMBULATORY_CARE_PROVIDER_SITE_OTHER): Payer: BC Managed Care – PPO

## 2021-07-18 ENCOUNTER — Other Ambulatory Visit: Payer: Self-pay

## 2021-07-18 DIAGNOSIS — R059 Cough, unspecified: Secondary | ICD-10-CM | POA: Diagnosis not present

## 2021-07-18 DIAGNOSIS — J069 Acute upper respiratory infection, unspecified: Secondary | ICD-10-CM

## 2021-07-18 DIAGNOSIS — R051 Acute cough: Secondary | ICD-10-CM

## 2021-07-18 DIAGNOSIS — I10 Essential (primary) hypertension: Secondary | ICD-10-CM

## 2021-07-18 DIAGNOSIS — R0902 Hypoxemia: Secondary | ICD-10-CM

## 2021-07-18 MED ORDER — PROMETHAZINE-DM 6.25-15 MG/5ML PO SYRP: 5.0000 mL | ORAL_SOLUTION | Freq: Four times a day (QID) | ORAL | 0 refills | Status: DC | PRN

## 2021-07-18 NOTE — ED Provider Notes (Signed)
?[name] ? ? ?[id number] ?07/18/21 Arrival Time: [phone] ? ?ASSESSMENT & PLAN: ? ?1. Acute cough   ?2. Viral URI with cough   ?3. Elevated blood pressure reading in office with diagnosis of hypertension   ? ?Discussed typical duration of viral illnesses. ?Viral testing sent. ?OTC symptom care as needed. ? ?I have personally viewed the imaging studies ordered this visit. ?Normal CXR. ? ?Begin: ?New Prescriptions  ? PROMETHAZINE-DEXTROMETHORPHAN (PROMETHAZINE-DM) 6.25-15 MG/5ML SYRUP    Take 5 mLs by mouth 4 (four) times daily as needed for cough.  ? ?Will recheck BP at home and ensure she has taken BP med today. ? ? Follow-up Information   ? ? Lindell Spar, MD.   ?Specialty: Internal Medicine ?Why: As needed. ?Contact information: ?[name] ?[place] [address] ?[phone] ? ? ?  ?  ? ?  ?  ? ?  ? ? ?Reviewed expectations re: course of current medical issues. Questions answered. ?Outlined signs and symptoms indicating need for more acute intervention. ?Understanding verbalized. ?After Visit Summary given. ? ? ?SUBJECTIVE: ?History from: Patient. ?Catherine Hunter is a 46 y.o. female. Reports: nasal congestion, body aches, coughing; abrupt onset; x 3-4 days. Denies: fever and difficulty breathing. Normal PO intake without n/v/d. ? ?Increased blood pressure noted today. Reports that she is treated for HTN. ?She reports taking medications as instructed, no chest pain on exertion, no dyspnea on exertion, no swelling of ankles, no orthostatic dizziness or lightheadedness, no orthopnea or paroxysmal nocturnal dyspnea, and no palpitations. ? ?OBJECTIVE: ? ?Vitals:  ? 07/18/21 0854 07/18/21 0856 07/18/21 0929  ?BP: (!) 163/115    ?Pulse: 84    ?Resp: 18    ?Temp: 98.2 ?F (36.8 ?C)    ?TempSrc: Oral    ?SpO2: (!) 84%  98%  ?Weight:  73.9 kg   ?Height:  5' 1.5" (1.562 m)   ?  ?General appearance: alert; no distress ?Eyes: PERRLA; EOMI; conjunctiva normal ?HENT: [place]; AT; with nasal congestion ?Neck:  supple  ?Lungs: speaks full sentences without difficulty; unlabored; CTAB; dry cough ?Extremities: no edema ?Skin: warm and dry ?Neurologic: normal gait ?Psychological: alert and cooperative; normal mood and affect ? ?Labs: ? ?Labs Reviewed  ?COVID-19, FLU A+B NAA  ? ? ?Imaging: ?DG Chest 2 View ? ?Result Date: 07/18/2021 ?CLINICAL DATA:  Cough, hypoxia EXAM: CHEST - 2 VIEW COMPARISON:  04/06/2014 FINDINGS: The heart size and mediastinal contours are within normal limits. Both lungs are clear. The visualized skeletal structures are unremarkable. IMPRESSION: No active cardiopulmonary disease. Electronically Signed   By: Davina Poke D.O.   On: 07/18/2021 09:20  ? ? ?Allergies  ?Allergen Reactions  ? Hydrocodone Hives and Itching  ? Oxycodone Hives and Itching  ? ? ?Past Medical History:  ?Diagnosis Date  ? Anemia   ? Anxiety   ? Asthma   ? Endometrial polyp 03/31/2020  ? Family history of adverse reaction to anesthesia   ? MOTHER PONV  ? Intramural and subserous leiomyoma of uterus 03/31/2020  ? Iron deficiency anemia secondary to blood loss (chronic) 03/31/2020  ? SBO (small bowel obstruction) ([place]) 04/28/2016  ? ?Social History  ? ?Socioeconomic History  ? Marital status: Married  ?  Spouse name: Not on file  ? Number of children: Not on file  ? Years of education: Not on file  ? Highest education level: Not on file  ?Occupational History  ? Not on file  ?Tobacco Use  ? Smoking status: Some Days  ?  Packs/day: 0.25  ?  Years: 4.00  ?  Pack years: 1.00  ?  Types: Cigars, Cigarettes  ? Smokeless tobacco: Never  ?Vaping Use  ? Vaping Use: Never used  ?Substance and Sexual Activity  ? Alcohol use: Yes  ?  Comment: 2 drinks wine/ weekly  ? Drug use: No  ? Sexual activity: Yes  ?  Birth control/protection: Abstinence  ?Other Topics Concern  ? Not on file  ?Social History Narrative  ? Not on file  ? ?Social Determinants of Health  ? ?Financial Resource Strain: Not on file  ?Food Insecurity: Not on file  ?Transportation  Needs: Not on file  ?Physical Activity: Not on file  ?Stress: Not on file  ?Social Connections: Not on file  ?Intimate Partner Violence: Not on file  ? ?Family History  ?Problem Relation Age of Onset  ? Hypertension Mother   ? Hypertension Father   ? Throat cancer Father   ? Heart disease Father   ? Hypertension Sister   ? Hypertension Sister   ? Uterine cancer Neg Hx   ? Ovarian cancer Neg Hx   ? ?Past Surgical History:  ?Procedure Laterality Date  ? APPENDECTOMY    ? AT SAME TIME AS COLON SURGERY  ? [place]  ? COLON SURGERY    ? BOWEL BLOCKAGE  ? CYSTOSCOPY N/A 03/31/2020  ? Procedure: CYSTOSCOPY;  Surgeon: Will Bonnet, MD;  Location: [facility];  Service: Gynecology;  Laterality: N/A;  ? ECTOPIC PREGNANCY SURGERY  2000  ? x2  ? HERNIA REPAIR    ? LAPAROSCOPIC GASTRIC SLEEVE RESECTION  07/20/2014  ? LAPAROTOMY N/A 04/29/2016  ? Procedure: EXPLORATORY LAPAROTOMY and appendectomy;  Surgeon: Florene Glen, MD;  Location: [facility];  Service: General;  Laterality: N/A;  ? TOTAL LAPAROSCOPIC HYSTERECTOMY WITH SALPINGECTOMY Bilateral 03/31/2020  ? Procedure: TOTAL LAPAROSCOPIC HYSTERECTOMY WITH BILATERAL SALPINGECTOMY;  Surgeon: Will Bonnet, MD;  Location: [facility];  Service: Gynecology;  Laterality: Bilateral;  ? ?  ?Catherine Kick, MD ?07/18/21 8841 ? ?

## 2021-07-18 NOTE — ED Triage Notes (Signed)
Pt reports cough, sore throat since Thursday. Pt reports intermittent diarrhea, headache, fatigue. Pt reports was sent home on Sunday from work.  ?

## 2021-07-18 NOTE — Discharge Instructions (Addendum)
You have been tested for COVID-19/influenza today. ?If your test returns positive, you will receive a phone call from [facility] regarding your results. ?Negative test results are not called. ?Both positive and negative results area always visible on MyChart. ?If you do not have a MyChart account, sign up instructions are provided in your discharge papers. ?Please do not hesitate to contact us should you have questions or concerns. ? ?

## 2021-07-19 LAB — COVID-19, FLU A+B NAA
Influenza A, NAA: NOT DETECTED
Influenza B, NAA: NOT DETECTED
SARS-CoV-2, NAA: NOT DETECTED

## 2022-02-26 ENCOUNTER — Ambulatory Visit
Admission: EM | Admit: 2022-02-26 | Discharge: 2022-02-26 | Disposition: A | Discharge status: Home / Self Care | Payer: BC Managed Care – PPO | Attending: Family Medicine | Admitting: Family Medicine

## 2022-02-26 DIAGNOSIS — Z1152 Encounter for screening for COVID-19: Secondary | ICD-10-CM | POA: Diagnosis not present

## 2022-02-26 DIAGNOSIS — J069 Acute upper respiratory infection, unspecified: Secondary | ICD-10-CM | POA: Diagnosis present

## 2022-02-26 LAB — RESP PANEL BY RT-PCR (FLU A&B, COVID) ARPGX2
Influenza A by PCR: NEGATIVE
Influenza B by PCR: NEGATIVE
SARS Coronavirus 2 by RT PCR: NEGATIVE

## 2022-02-26 LAB — POCT RAPID STREP A (OFFICE): Rapid Strep A Screen: NEGATIVE

## 2022-02-26 MED ORDER — ALBUTEROL SULFATE HFA 108 (90 BASE) MCG/ACT IN AERS: 2.0000 | INHALATION_SPRAY | RESPIRATORY_TRACT | 0 refills | Status: DC | PRN

## 2022-02-26 MED ORDER — PROMETHAZINE-DM 6.25-15 MG/5ML PO SYRP: 5.0000 mL | ORAL_SOLUTION | Freq: Four times a day (QID) | ORAL | 0 refills | Status: DC | PRN

## 2022-02-26 NOTE — ED Provider Notes (Signed)
[facility]    CSN: [id number] Arrival date & time: 02/26/22  1856      History   Chief Complaint No chief complaint on file.   HPI Catherine Hunter is a 46 y.o. female.   Presenting today with 3-day history of hacking cough, nasal congestion, sinus pain and pressure, sore throat, fatigue.  Denies chest pain, shortness of breath, abdominal pain, nausea vomiting diarrhea, fever, chills, body aches.  Taking TheraFlu with no relief.  History of asthma, states she has an albuterol inhaler at home but it is almost out.  No known sick contacts though does work in the [employer].    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   SBO (small bowel obstruction) ([place]) 04/28/2016    Patient Active Problem List   Diagnosis Date Noted   Essential hypertension 04/10/2021   S/P gastric sleeve procedure 04/10/2021   Depression with anxiety 04/10/2021   Tobacco abuse 04/10/2021   Iron deficiency anemia 10/01/2019    Past Surgical History:  Procedure Laterality Date   APPENDECTOMY     AT SAME TIME AS COLON [place] N/A 03/31/2020   Procedure: CYSTOSCOPY;  Surgeon: Will Bonnet, MD;  Location: [facility];  Service: Gynecology;  Laterality: N/A;   ECTOPIC PREGNANCY SURGERY  2000   x2   HERNIA REPAIR     LAPAROSCOPIC GASTRIC SLEEVE RESECTION  07/20/2014   LAPAROTOMY N/A 04/29/2016   Procedure: EXPLORATORY LAPAROTOMY and appendectomy;  Surgeon: Florene Glen, MD;  Location: [facility];  Service: General;  Laterality: N/A;   TOTAL LAPAROSCOPIC HYSTERECTOMY WITH SALPINGECTOMY Bilateral 03/31/2020   Procedure: TOTAL LAPAROSCOPIC HYSTERECTOMY WITH  BILATERAL SALPINGECTOMY;  Surgeon: Will Bonnet, MD;  Location: [facility];  Service: Gynecology;  Laterality: Bilateral;    OB History     Gravida  5   Para  1   Term      Preterm      AB  2   Living         SAB      IAB      Ectopic  2   Multiple      Live Births               Home Medications    Prior to Admission medications   Medication Sig Start Date End Date Taking? Authorizing Provider  promethazine-dextromethorphan (PROMETHAZINE-DM) 6.25-15 MG/5ML syrup Take 5 mLs by mouth 4 (four) times daily as needed. 02/26/22  Yes Volney American, PA-C  albuterol (VENTOLIN HFA) 108 (90 Base) MCG/ACT inhaler Inhale 2 puffs into the lungs every 4 (four) hours as needed for wheezing or shortness of breath. 02/26/22   Volney American, PA-C  amLODipine (NORVASC) 5 MG tablet Take 1 tablet (5 mg total) by mouth daily. 02/21/21   Melynda Ripple, MD  ferrous sulfate (FERROUSUL) 325 (65 FE) MG tablet Take 1 tablet (325 mg total) by mouth daily with breakfast. 11/26/19   Lloyd Huger, MD  FLUoxetine (PROZAC) 10 MG tablet Take 1 tablet (10 mg total) by mouth daily. 04/10/21   Posey Pronto,  Colin Broach, MD  hydrOXYzine (VISTARIL) 25 MG capsule Take 1 capsule (25 mg total) by mouth every 8 (eight) hours as needed. 04/10/21   Lindell Spar, MD  Multiple Vitamins-Minerals (MULTIVITAMIN WITH MINERALS) tablet Take 1 tablet by mouth 4 (four) times a week.     [provider]  ondansetron (ZOFRAN ODT) 8 MG disintegrating tablet Take 1 tablet (8 mg total) by mouth every 8 (eight) hours as needed for nausea or vomiting. 02/21/21   Melynda Ripple, MD  promethazine-dextromethorphan (PROMETHAZINE-DM) 6.25-15 MG/5ML syrup Take 5 mLs by mouth 4 (four) times daily as needed for cough. 07/18/21   Vanessa Kick, MD  telmisartan (MICARDIS) 20 MG tablet Take 1 tablet (20 mg total) by mouth daily. 04/10/21   Lindell Spar, MD    Family History Family History  Problem  Relation Age of Onset   Hypertension Mother    Hypertension Father    Throat cancer Father    Heart disease Father    Hypertension Sister    Hypertension Sister    Uterine cancer Neg Hx    Ovarian cancer Neg Hx     Social History Social History   Tobacco Use   Smoking status: Some Days    Packs/day: 0.25    Years: 4.00    Total pack years: 1.00    Types: Cigars, Cigarettes   Smokeless tobacco: Never  Vaping Use   Vaping Use: Never used  Substance Use Topics   Alcohol use: Yes    Comment: 2 drinks wine/ weekly   Drug use: No     Allergies   Hydrocodone and Oxycodone   Review of Systems Review of Systems Per HPI  Physical Exam Triage Vital Signs ED Triage Vitals  Enc Vitals Group     BP 02/26/22 0853 (!) 145/93     Pulse Rate 02/26/22 0853 83     Resp 02/26/22 0853 18     Temp 02/26/22 0853 98.3 F (36.8 C)     Temp Source 02/26/22 0853 Oral     SpO2 02/26/22 0853 93 %     Weight --      Height --      Head Circumference --      Peak Flow --      Pain Score 02/26/22 0858 3     Pain Loc --      Pain Edu? --      Excl. in [place]? --    No data found.  Updated Vital Signs BP (!) 145/93 (BP Location: Right Arm)   Pulse 83   Temp 98.3 F (36.8 C) (Oral)   Resp 18   LMP 03/13/2020 (Approximate)   SpO2 93%   Visual Acuity Right Eye Distance:   Left Eye Distance:   Bilateral Distance:    Right Eye Near:   Left Eye Near:    Bilateral Near:     Physical Exam Vitals and nursing note reviewed.  Constitutional:      Appearance: Normal appearance.  HENT:     Head: Atraumatic.     Right Ear: Tympanic membrane and external ear normal.     Left Ear: Tympanic membrane and external ear normal.     Nose: Rhinorrhea present.     Mouth/Throat:     Mouth: Mucous membranes are moist.     Pharynx: Posterior oropharyngeal erythema present.  Eyes:     Extraocular Movements: Extraocular movements intact.     Conjunctiva/sclera: Conjunctivae normal.   Cardiovascular:  Rate and Rhythm: Normal rate and regular rhythm.     Heart sounds: Normal heart sounds.  Pulmonary:     Effort: Pulmonary effort is normal.     Breath sounds: Normal breath sounds. No wheezing or rales.  Musculoskeletal:        General: Normal range of motion.     Cervical back: Normal range of motion and neck supple.  Skin:    General: Skin is warm and dry.  Neurological:     Mental Status: She is alert and oriented to person, place, and time.  Psychiatric:        Mood and Affect: Mood normal.        Thought Content: Thought content normal.      UC Treatments / Results  Labs (all labs ordered are listed, but only abnormal results are displayed) Labs Reviewed  RESP PANEL BY RT-PCR (FLU A&B, COVID) [id number]  POCT RAPID STREP A (OFFICE)    EKG   Radiology No results found.  Procedures Procedures (including critical care time)  Medications Ordered in UC Medications - No data to display  Initial Impression / Assessment and Plan / UC Course  I have reviewed the triage vital signs and the nursing notes.  Pertinent labs & imaging results that were available during my care of the patient were reviewed by me and considered in my medical decision making (see chart for details).     Vitals and exam overall reassuring and suggestive of a viral upper respiratory infection.  Treat with Phenergan DM, will refill albuterol inhaler for as needed use and discussed supportive over-the-counter medications and home care additionally.  Return for worsening symptoms.  Work note given.  Final Clinical Impressions(s) / UC Diagnoses   Final diagnoses:  Viral URI with cough   Discharge Instructions   None    ED Prescriptions     Medication Sig Dispense Auth. Provider   promethazine-dextromethorphan (PROMETHAZINE-DM) 6.25-15 MG/5ML syrup Take 5 mLs by mouth 4 (four) times daily as needed. 100 mL Volney American, PA-C   albuterol (VENTOLIN HFA) 108 (90  Base) MCG/ACT inhaler Inhale 2 puffs into the lungs every 4 (four) hours as needed for wheezing or shortness of breath. 18 g Volney American, [place]      PDMP not reviewed this encounter.   Merrie Roof [place], [place] 02/26/22 [phone]

## 2022-02-26 NOTE — ED Triage Notes (Signed)
Pt report coughing, nasal drainge and sore throat since Friday. Took theraflu Saturday but no relief.    Pt doesn't take her meds

## 2022-07-09 ENCOUNTER — Other Ambulatory Visit: Payer: Self-pay

## 2022-07-09 ENCOUNTER — Telehealth: Payer: Self-pay

## 2022-07-09 ENCOUNTER — Encounter: Payer: Self-pay | Admitting: Physician Assistant

## 2022-07-09 DIAGNOSIS — Z1211 Encounter for screening for malignant neoplasm of colon: Secondary | ICD-10-CM

## 2022-07-09 MED ORDER — NA SULFATE-K SULFATE-MG SULF 17.5-3.13-1.6 GM/177ML PO SOLN: 1.0000 | Freq: Once | ORAL | 0 refills | Status: AC

## 2022-07-09 NOTE — Telephone Encounter (Signed)
Gastroenterology Pre-Procedure Review  Request Date: 08/27/22 Requesting Physician: Dr. Vicente Males  PATIENT REVIEW QUESTIONS: The patient responded to the following health history questions as indicated:    1. Are you having any GI issues? no 2. Do you have a personal history of Polyps? no 3. Do you have a family history of Colon Cancer or Polyps? no 4. Diabetes Mellitus? no 5. Joint replacements in the past 12 months?no 6. Major health problems in the past 3 months?no 7. Any artificial heart valves, MVP, or defibrillator?no    MEDICATIONS & ALLERGIES:    Patient reports the following regarding taking any anticoagulation/antiplatelet therapy:   Plavix, Coumadin, Eliquis, Xarelto, Lovenox, Pradaxa, Brilinta, or Effient? no Aspirin? no  Patient confirms/reports the following medications:  Current Outpatient Medications  Medication Sig Dispense Refill   albuterol (VENTOLIN HFA) 108 (90 Base) MCG/ACT inhaler Inhale 2 puffs into the lungs every 4 (four) hours as needed for wheezing or shortness of breath. 18 g 0   amLODipine (NORVASC) 5 MG tablet Take 1 tablet (5 mg total) by mouth daily. 30 tablet 0   hydrOXYzine (VISTARIL) 25 MG capsule Take 1 capsule (25 mg total) by mouth every 8 (eight) hours as needed. 30 capsule 0   Multiple Vitamins-Minerals (MULTIVITAMIN WITH MINERALS) tablet Take 1 tablet by mouth 4 (four) times a week.      ondansetron (ZOFRAN ODT) 8 MG disintegrating tablet Take 1 tablet (8 mg total) by mouth every 8 (eight) hours as needed for nausea or vomiting. 20 tablet 0   ferrous sulfate (FERROUSUL) 325 (65 FE) MG tablet Take 1 tablet (325 mg total) by mouth daily with breakfast. (Patient not taking: Reported on 07/09/2022) 30 tablet 1   FLUoxetine (PROZAC) 10 MG tablet Take 1 tablet (10 mg total) by mouth daily. (Patient not taking: Reported on 07/09/2022) 30 tablet 2   promethazine-dextromethorphan (PROMETHAZINE-DM) 6.25-15 MG/5ML syrup Take 5 mLs by mouth 4 (four) times daily as  needed for cough. (Patient not taking: Reported on 07/09/2022) 118 mL 0   promethazine-dextromethorphan (PROMETHAZINE-DM) 6.25-15 MG/5ML syrup Take 5 mLs by mouth 4 (four) times daily as needed. (Patient not taking: Reported on 07/09/2022) 100 mL 0   telmisartan (MICARDIS) 20 MG tablet Take 1 tablet (20 mg total) by mouth daily. (Patient not taking: Reported on 07/09/2022) 30 tablet 1   No current facility-administered medications for this visit.    Patient confirms/reports the following allergies:  Allergies  Allergen Reactions   Hydrocodone Hives and Itching   Oxycodone Hives and Itching    No orders of the defined types were placed in this encounter.   AUTHORIZATION INFORMATION Primary Insurance: 1D#: Group #:  Secondary Insurance: 1D#: Group #:  SCHEDULE INFORMATION: Date: 08/27/22 Time: Location: [facility]

## 2022-08-24 ENCOUNTER — Encounter: Payer: Self-pay | Admitting: Gastroenterology

## 2022-08-24 ENCOUNTER — Telehealth: Payer: Self-pay

## 2022-08-24 NOTE — Telephone Encounter (Signed)
Contacted patient to give arrival time for Monday's Colonoscopy.  She said she has too many things going on and will need to reschedule.  She will call back to reschedule on another day.  Trish in Endo notified.  Thanks, [place], [place]

## 2022-08-27 ENCOUNTER — Ambulatory Visit
Admission: RE | Admit: 2022-08-27 | Payer: BC Managed Care – PPO | Source: Ambulatory Visit | Admitting: Gastroenterology

## 2022-08-27 ENCOUNTER — Encounter: Admission: RE | Payer: Self-pay | Source: Ambulatory Visit

## 2022-08-27 SURGERY — COLONOSCOPY WITH PROPOFOL
Anesthesia: General

## 2023-04-17 ENCOUNTER — Emergency Department (HOSPITAL_COMMUNITY)
Admission: EM | Admit: 2023-04-17 | Discharge: 2023-04-17 | Disposition: A | Discharge status: Home / Self Care | Payer: BC Managed Care – PPO | Attending: Emergency Medicine | Admitting: Emergency Medicine

## 2023-04-17 ENCOUNTER — Other Ambulatory Visit: Payer: Self-pay

## 2023-04-17 ENCOUNTER — Encounter (HOSPITAL_COMMUNITY): Payer: Self-pay | Admitting: Emergency Medicine

## 2023-04-17 DIAGNOSIS — Z79899 Other long term (current) drug therapy: Secondary | ICD-10-CM | POA: Insufficient documentation

## 2023-04-17 DIAGNOSIS — R000 Tachycardia, unspecified: Secondary | ICD-10-CM | POA: Insufficient documentation

## 2023-04-17 DIAGNOSIS — R5383 Other fatigue: Secondary | ICD-10-CM | POA: Diagnosis not present

## 2023-04-17 DIAGNOSIS — Y906 Blood alcohol level of 120-199 mg/100 ml: Secondary | ICD-10-CM | POA: Insufficient documentation

## 2023-04-17 DIAGNOSIS — D649 Anemia, unspecified: Secondary | ICD-10-CM | POA: Insufficient documentation

## 2023-04-17 DIAGNOSIS — R110 Nausea: Secondary | ICD-10-CM | POA: Diagnosis not present

## 2023-04-17 DIAGNOSIS — R197 Diarrhea, unspecified: Secondary | ICD-10-CM | POA: Insufficient documentation

## 2023-04-17 DIAGNOSIS — F329 Major depressive disorder, single episode, unspecified: Secondary | ICD-10-CM | POA: Diagnosis present

## 2023-04-17 DIAGNOSIS — F1090 Alcohol use, unspecified, uncomplicated: Secondary | ICD-10-CM | POA: Diagnosis not present

## 2023-04-17 DIAGNOSIS — Z789 Other specified health status: Secondary | ICD-10-CM

## 2023-04-17 DIAGNOSIS — R4589 Other symptoms and signs involving emotional state: Secondary | ICD-10-CM

## 2023-04-17 LAB — COMPREHENSIVE METABOLIC PANEL
ALT: 34 U/L (ref 0–44)
AST: 50 U/L — ABNORMAL HIGH (ref 15–41)
Albumin: 4.4 g/dL (ref 3.5–5.0)
Alkaline Phosphatase: 78 U/L (ref 38–126)
Anion gap: 17 — ABNORMAL HIGH (ref 5–15)
BUN: 11 mg/dL (ref 6–20)
CO2: 20 mmol/L — ABNORMAL LOW (ref 22–32)
Calcium: 9.2 mg/dL (ref 8.9–10.3)
Chloride: 101 mmol/L (ref 98–111)
Creatinine, Ser: 0.66 mg/dL (ref 0.44–1.00)
GFR, Estimated: >60 mL/min (ref >60)
Glucose, Bld: 88 mg/dL (ref 70–99)
Potassium: 3.8 mmol/L (ref 3.5–5.1)
Sodium: 138 mmol/L (ref 135–145)
Total Bilirubin: 0.7 mg/dL (ref <1.2)
Total Protein: 8.6 g/dL — ABNORMAL HIGH (ref 6.5–8.1)

## 2023-04-17 LAB — ACETAMINOPHEN LEVEL: Acetaminophen (Tylenol), Serum: <10 ug/mL — ABNORMAL LOW (ref 10–30)

## 2023-04-17 LAB — RAPID URINE DRUG SCREEN, HOSP PERFORMED
Amphetamines: NOT DETECTED
Barbiturates: NOT DETECTED
Benzodiazepines: NOT DETECTED
Cocaine: NOT DETECTED
Opiates: NOT DETECTED
Tetrahydrocannabinol: NOT DETECTED

## 2023-04-17 LAB — CBC
HCT: 43.4 % (ref 36.0–46.0)
Hemoglobin: 14.3 g/dL (ref 12.0–15.0)
MCH: 31.7 pg (ref 26.0–34.0)
MCHC: 32.9 g/dL (ref 30.0–36.0)
MCV: 96.2 fL (ref 80.0–100.0)
Platelets: 210 10*3/uL (ref 150–400)
RBC: 4.51 MIL/uL (ref 3.87–5.11)
RDW: 14 % (ref 11.5–15.5)
WBC: 2.2 10*3/uL — ABNORMAL LOW (ref 4.0–10.5)
nRBC: 0 % (ref 0.0–0.2)

## 2023-04-17 LAB — ETHANOL: Alcohol, Ethyl (B): 130 mg/dL — ABNORMAL HIGH (ref <10)

## 2023-04-17 LAB — SALICYLATE LEVEL: Salicylate Lvl: <7 mg/dL — ABNORMAL LOW (ref 7.0–30.0)

## 2023-04-17 MED ORDER — ONDANSETRON 4 MG PO TBDP
4.0000 mg | ORAL_TABLET | Freq: Once | ORAL | Status: AC
  Administered 2023-04-17: 4 mg via ORAL
  Filled 2023-04-17: qty 1

## 2023-04-17 NOTE — ED Provider Notes (Signed)
[place] EMERGENCY DEPARTMENT AT [facility] Provider Note   CSN: [id number] Arrival date & time: 04/17/23  0631     History  Chief Complaint  Patient presents with   V70.1    Catherine Hunter is a 47 y.o. female.  47 year old female with history of anxiety and iron deficiency anemia who presents to the emergency department with depressed mood and fatigue.  Says that she has felt very stressed and depressed recently by the loss of her grandchild due to SIDS.  Apparently her sons other child had to be taken by CPS after a case was open due to SIDS death.  Says that over the past few days has not been feeling well has been somewhat nauseous and not eating.  No fevers.  Says his nausea is improved as well as some diarrhea that she had been having.  Yesterday feels like she had a mental breakdown and started crying.  Says that she felt like she was she could go to sleep and just not wake up but did not have a plan to kill herself.  Tells me that she is not currently suicidal.  No prior suicide attempts.  Does drink alcohol and did have some last night.  Does not drink every day.  No history of alcohol drawl's.  No other drug use.       Home Medications Prior to Admission medications   Medication Sig Start Date End Date Taking? Authorizing Provider  albuterol (VENTOLIN HFA) 108 (90 Base) MCG/ACT inhaler Inhale 2 puffs into the lungs every 4 (four) hours as needed for wheezing or shortness of breath. 02/26/22   Particia Nearing, PA-C  amLODipine (NORVASC) 5 MG tablet Take 1 tablet (5 mg total) by mouth daily. 02/21/21   Domenick Gong, MD  ferrous sulfate (FERROUSUL) 325 (65 FE) MG tablet Take 1 tablet (325 mg total) by mouth daily with breakfast. Patient not taking: Reported on 07/09/2022 11/26/19   Jeralyn Ruths, MD  FLUoxetine (PROZAC) 10 MG tablet Take 1 tablet (10 mg total) by mouth daily. Patient not taking: Reported on 07/09/2022 04/10/21   Anabel Halon, MD   hydrOXYzine (VISTARIL) 25 MG capsule Take 1 capsule (25 mg total) by mouth every 8 (eight) hours as needed. 04/10/21   Anabel Halon, MD  Multiple Vitamins-Minerals (MULTIVITAMIN WITH MINERALS) tablet Take 1 tablet by mouth 4 (four) times a week.     [provider]  ondansetron (ZOFRAN ODT) 8 MG disintegrating tablet Take 1 tablet (8 mg total) by mouth every 8 (eight) hours as needed for nausea or vomiting. 02/21/21   Domenick Gong, MD  promethazine-dextromethorphan (PROMETHAZINE-DM) 6.25-15 MG/5ML syrup Take 5 mLs by mouth 4 (four) times daily as needed for cough. Patient not taking: Reported on 07/09/2022 07/18/21   Mardella Layman, MD  promethazine-dextromethorphan (PROMETHAZINE-DM) 6.25-15 MG/5ML syrup Take 5 mLs by mouth 4 (four) times daily as needed. Patient not taking: Reported on 07/09/2022 02/26/22   Particia Nearing, PA-C  telmisartan (MICARDIS) 20 MG tablet Take 1 tablet (20 mg total) by mouth daily. Patient not taking: Reported on 07/09/2022 04/10/21   Anabel Halon, MD      Allergies    Hydrocodone and Oxycodone    Review of Systems   Review of Systems  Physical Exam Updated Vital Signs BP 116/67 (BP Location: Right Arm)   Pulse 80   Temp (!) 97.5 F (36.4 C)   Resp 20   Ht 5\' 5"  (1.651 m)  Wt 72.6 kg   LMP 03/13/2020 (Approximate)   SpO2 98%   BMI 26.63 kg/m  Physical Exam Vitals and nursing note reviewed.  Constitutional:      General: She is not in acute distress.    Appearance: She is well-developed.  HENT:     Head: Normocephalic and atraumatic.     Right Ear: External ear normal.     Left Ear: External ear normal.     Nose: Nose normal.  Eyes:     Extraocular Movements: Extraocular movements intact.     Conjunctiva/sclera: Conjunctivae normal.     Pupils: Pupils are equal, round, and reactive to light.  Cardiovascular:     Rate and Rhythm: Normal rate and regular rhythm.     Heart sounds: No murmur heard. Pulmonary:     Effort:  Pulmonary effort is normal. No respiratory distress.     Breath sounds: Normal breath sounds.  Abdominal:     General: Abdomen is flat. There is no distension.     Palpations: Abdomen is soft. There is no mass.     Tenderness: There is no abdominal tenderness. There is no guarding.  Musculoskeletal:     Cervical back: Normal range of motion and neck supple.     Right lower leg: No edema.     Left lower leg: No edema.  Skin:    General: Skin is warm and dry.  Neurological:     Mental Status: She is alert and oriented to person, place, and time. Mental status is at baseline.  Psychiatric:        Mood and Affect: Mood normal.     ED Results / Procedures / Treatments   Labs (all labs ordered are listed, but only abnormal results are displayed) Labs Reviewed  COMPREHENSIVE METABOLIC PANEL - Abnormal; Notable for the following components:      Result Value   CO2 20 (*)    Total Protein 8.6 (*)    AST 50 (*)    Anion gap 17 (*)    All other components within normal limits  ETHANOL - Abnormal; Notable for the following components:   Alcohol, Ethyl (B) 130 (*)    All other components within normal limits  SALICYLATE LEVEL - Abnormal; Notable for the following components:   Salicylate Lvl <7.0 (*)    All other components within normal limits  ACETAMINOPHEN LEVEL - Abnormal; Notable for the following components:   Acetaminophen (Tylenol), Serum <10 (*)    All other components within normal limits  CBC - Abnormal; Notable for the following components:   WBC 2.2 (*)    All other components within normal limits  RAPID URINE DRUG SCREEN, HOSP PERFORMED    EKG EKG Interpretation Date/Time:  Wednesday April 17 2023 06:46:39 EST Ventricular Rate:  102 PR Interval:  172 QRS Duration:  72 QT Interval:  374 QTC Calculation: 487 R Axis:   49  Text Interpretation: Sinus tachycardia with Premature ventricular complexes or Fusion complexes Right atrial enlargement Borderline ECG  Confirmed by Vonita Moss [phone]) on 04/17/2023 7:49:14 AM  Radiology No results found.  Procedures Procedures    Medications Ordered in ED Medications  ondansetron (ZOFRAN-ODT) disintegrating tablet 4 mg (4 mg Oral Given 04/17/23 6045)    ED Course/ Medical Decision Making/ A&P                                 Medical Decision  Making Amount and/or Complexity of Data Reviewed Labs: ordered.  Risk Prescription drug management.   Catherine Hunter is a 47 y.o. female with comorbidities that complicate the patient evaluation including anxiety, alcohol use, and iron deficiency anemia who presents to the emergency department with depressed mood and fatigue  Initial Ddx:  Depression, suicidal ideation, substance-induced mood disorder, anemia, hypothyroidism  MDM/Course:  Patient presents to the emergency department with depressed mood.  Has been having some passive SI but no plan.  Also has been drinking recently and did have a blood alcohol level of 130.  On exam she is overall well-appearing.  I suspect that most of her symptoms are likely due to the difficult loss of her grandchild but appears that she is not actively suicidal at this time so does not warrant IVC for emergent psychiatric evaluation.  Labs without other acute abnormality that would explain her symptoms.  Will have her follow-up with psychiatry as an outpatient as well as her primary doctor regarding her symptoms.  Upon re-evaluation the patient was stable.  Did talk to the patient about her alcohol use and appears that she is not a daily drinker and does not have a history of alcohol withdrawal so do not see indication for Librium taper at this time.  This patient presents to the ED for concern of complaints listed in HPI, this involves an extensive number of treatment options, and is a complaint that carries with it a high risk of complications and morbidity. Disposition including potential need for admission  considered.   Dispo: DC Home. Return precautions discussed including, but not limited to, those listed in the AVS. Allowed pt time to ask questions which were answered fully prior to dc.  Records reviewed Outpatient Clinic Notes The following labs were independently interpreted: Chemistry and show no acute abnormality I personally reviewed and interpreted cardiac monitoring: normal sinus rhythm  I personally reviewed and interpreted the pt's EKG: see above for interpretation  I have reviewed the patients home medications and made adjustments as needed Social Determinants of health:  Etoh use  Portions of this note were generated with Scientist, clinical (histocompatibility and immunogenetics). Dictation errors may occur despite best attempts at proofreading.     Final Clinical Impression(s) / ED Diagnoses Final diagnoses:  Depressed mood  Alcohol use    Rx / DC Orders ED Discharge Orders     None         Rondel Baton, MD 04/17/23 [phone]

## 2023-04-17 NOTE — Discharge Instructions (Signed)
You were seen for your weakness and depressed mood today in the emergency department.   At home, please stay well-hydrated for your weakness.  Go to behavioral health urgent care today or tomorrow to talk about your depression with them.  If you are going today please have someone drive you since you still have alcohol in your bloodstream.  Follow-up with your primary doctor in 2-3 days regarding your visit.    Return immediately to the emergency department if you experience any of the following: Worsening thoughts of suicide, or any other concerning symptoms.    Thank you for visiting our Emergency Department. It was a pleasure taking care of you today.

## 2023-04-17 NOTE — ED Triage Notes (Addendum)
Pt here stating she doesn't feel well. States she feels weak and hasn't been able to eat or drink in 3 days d/t "lack of desire". Pt stated that yesterday, she had thoughts of killing self and states that she is still having those thoughts but that she doesn't plan on acting on them. Pt tearful in triage.

## 2023-08-28 ENCOUNTER — Ambulatory Visit
Admission: EM | Admit: 2023-08-28 | Discharge: 2023-08-28 | Disposition: A | Discharge status: Home / Self Care | Attending: Nurse Practitioner | Admitting: Nurse Practitioner

## 2023-08-28 DIAGNOSIS — J45901 Unspecified asthma with (acute) exacerbation: Secondary | ICD-10-CM

## 2023-08-28 DIAGNOSIS — J069 Acute upper respiratory infection, unspecified: Secondary | ICD-10-CM | POA: Diagnosis not present

## 2023-08-28 LAB — POC SARS CORONAVIRUS 2 AG -  ED: SARS Coronavirus 2 Ag: NEGATIVE

## 2023-08-28 MED ORDER — ALBUTEROL SULFATE HFA 108 (90 BASE) MCG/ACT IN AERS: 2.0000 | INHALATION_SPRAY | Freq: Four times a day (QID) | RESPIRATORY_TRACT | 0 refills | Status: AC | PRN

## 2023-08-28 MED ORDER — PROMETHAZINE-DM 6.25-15 MG/5ML PO SYRP: 5.0000 mL | ORAL_SOLUTION | Freq: Four times a day (QID) | ORAL | 0 refills | Status: AC | PRN

## 2023-08-28 MED ORDER — IPRATROPIUM-ALBUTEROL 0.5-2.5 (3) MG/3ML IN SOLN
3.0000 mL | Freq: Once | RESPIRATORY_TRACT | Status: AC
  Administered 2023-08-28: 3 mL via RESPIRATORY_TRACT

## 2023-08-28 MED ORDER — METHYLPREDNISOLONE SODIUM SUCC 125 MG IJ SOLR
125.0000 mg | Freq: Once | INTRAMUSCULAR | Status: AC
  Administered 2023-08-28: 125 mg via INTRAMUSCULAR

## 2023-08-28 MED ORDER — PREDNISONE 20 MG PO TABS: 40.0000 mg | ORAL_TABLET | Freq: Every day | ORAL | 0 refills | Status: AC

## 2023-08-28 MED ORDER — FLUTICASONE PROPIONATE 50 MCG/ACT NA SUSP: 2.0000 | Freq: Every day | NASAL | 0 refills | Status: AC

## 2023-08-28 NOTE — ED Provider Notes (Signed)
 [facility]    CSN: [id number] Arrival date & time: 08/28/23  0806      History   Chief Complaint No chief complaint on file.   HPI Catherine Hunter is a 48 y.o. female.   The history is provided by the patient.   Patient presents for complaints of low-grade temperature, headache, nasal congestion, runny nose, cough, shortness of breath, and fatigue for the past several days.  Patient denies ear pain, ear drainage, wheezing, difficulty breathing, chest pain, abdominal pain, nausea, vomiting, diarrhea, or rash.  Patient reports underlying history of asthma, states that she does not have an albuterol  inhaler that she is currently using.  States that she has been taking a large amount of over-the-counter cough and cold medications which has impacted her blood pressure.  Patient denies any obvious known sick contacts.  Patient is requesting COVID test to return to work.  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   Depression with anxiety 04/10/2021   Tobacco abuse 04/10/2021   Iron deficiency anemia 10/01/2019    Past Surgical History:  Procedure Laterality Date   APPENDECTOMY     AT SAME TIME AS COLON SURGERY   CESAREAN SECTION  1997   COLON SURGERY     BOWEL BLOCKAGE   CYSTOSCOPY N/A 03/31/2020   Procedure: CYSTOSCOPY;  Surgeon: Kris Pester, MD;  Location: [facility] OR[facility];  Service: Gynecology;  Laterality: N/A;   ECTOPIC PREGNANCY SURGERY  2000   x2   HERNIA REPAIR     LAPAROSCOPIC GASTRIC SLEEVE RESECTION  07/20/2014    LAPAROTOMY N/A 04/29/2016   Procedure: EXPLORATORY LAPAROTOMY and appendectomy;  Surgeon: Claudia Cuff, MD;  Location: [facility] ORS;  Service: General;  Laterality: N/A;   TOTAL LAPAROSCOPIC HYSTERECTOMY WITH SALPINGECTOMY Bilateral 03/31/2020   Procedure: TOTAL LAPAROSCOPIC HYSTERECTOMY WITH BILATERAL SALPINGECTOMY;  Surgeon: Kris Pester, MD;  Location: [facility] OR[facility];  Service: Gynecology;  Laterality: Bilateral;    OB History     Gravida  5   Para  1   Term      Preterm      AB  2   Living         SAB      IAB      Ectopic  2   Multiple      Live Births               Home Medications    Prior to Admission medications   Medication Sig Start Date End Date Taking? Authorizing Provider  albuterol  (VENTOLIN  HFA) 108 (90 Base) MCG/ACT inhaler Inhale 2 puffs into the lungs every 4 (four) hours as needed for wheezing or shortness of breath. 02/26/22   Corbin Dess, PA-C  amLODipine  (NORVASC ) 5 MG tablet Take 1 tablet (5 mg total) by mouth daily. 02/21/21   Mortenson, Ashley, MD  ferrous sulfate  (FERROUSUL) 325 (65 FE) MG tablet Take 1 tablet (325 mg total) by mouth daily with breakfast. Patient not taking: Reported on 07/09/2022 11/26/19  Shellie Dials, MD  FLUoxetine  (PROZAC ) 10 MG tablet Take 1 tablet (10 mg total) by mouth daily. Patient not taking: Reported on 07/09/2022 04/10/21   Meldon Sport, MD  hydrOXYzine  (VISTARIL ) 25 MG capsule Take 1 capsule (25 mg total) by mouth every 8 (eight) hours as needed. 04/10/21   Patel, Rutwik K, MD  Multiple Vitamins-Minerals (MULTIVITAMIN WITH MINERALS) tablet Take 1 tablet by mouth 4 (four) times a week.     [provider]  ondansetron  (ZOFRAN  ODT) 8 MG disintegrating tablet Take 1 tablet (8 mg total) by mouth every 8 (eight) hours as needed for nausea or vomiting. 02/21/21   Mortenson, Ashley, MD  promethazine -dextromethorphan (PROMETHAZINE -DM) 6.25-15 MG/5ML syrup Take 5 mLs by mouth 4 (four) times  daily as needed for cough. Patient not taking: Reported on 07/09/2022 07/18/21   Afton Albright, MD  promethazine -dextromethorphan (PROMETHAZINE -DM) 6.25-15 MG/5ML syrup Take 5 mLs by mouth 4 (four) times daily as needed. Patient not taking: Reported on 07/09/2022 02/26/22   Corbin Dess, PA-C  telmisartan  (MICARDIS ) 20 MG tablet Take 1 tablet (20 mg total) by mouth daily. Patient not taking: Reported on 07/09/2022 04/10/21   Meldon Sport, MD    Family History Family History  Problem Relation Age of Onset   Hypertension Mother    Hypertension Father    Throat cancer Father    Heart disease Father    Hypertension Sister    Hypertension Sister    Uterine cancer Neg Hx    Ovarian cancer Neg Hx     Social History Social History   Tobacco Use   Smoking status: Some Days    Current packs/day: 0.25    Average packs/day: 0.3 packs/day for 4.0 years (1.0 ttl pk-yrs)    Types: Cigars, Cigarettes   Smokeless tobacco: Never  Vaping Use   Vaping status: Never Used  Substance Use Topics   Alcohol use: Yes    Comment: 2 drinks wine/ weekly   Drug use: No     Allergies   Hydrocodone, Oxycodone , and Amoxicillin    Review of Systems Review of Systems Per HPI    Systolic BP Percentile --      Diastolic BP Percentile --      Pulse Rate 08/28/23 0828 88     Resp 08/28/23 0828 20     Temp 08/28/23 0828 98.2 F (36.8 C)     Temp Source 08/28/23 0828 Oral     SpO2 08/28/23 0828 95 %     Weight --      Height --      Head Circumference --      Peak Flow --      Pain Score 08/28/23 0831 0     Pain Loc --      Pain Education --      Exclude from Growth Chart --    No data found.  Updated Vital Signs BP (!) 195/111 (BP Location: Right Arm)   Pulse 88   Temp 98.2 F (36.8 C) (Oral)   Resp 20   LMP 03/13/2020 (Approximate)   SpO2 95%   Visual Acuity Right Eye Distance:   Left Eye Distance:   Bilateral Distance:    Right Eye Near:   Left Eye Near:    Bilateral  Near:     Physical Exam Vitals and nursing note reviewed.  Constitutional:      General: She is not in acute distress.    Appearance: Normal appearance.  HENT:  Head: Normocephalic.     Right Ear: Tympanic membrane, ear canal and external ear normal.     Left Ear: Tympanic membrane, ear canal and external ear normal.     Nose: Congestion present.     Mouth/Throat:     Mouth: Mucous membranes are moist.     Pharynx: No posterior oropharyngeal erythema.     Comments: Cobblestoning present to posterior oropharynx  Eyes:     Extraocular Movements: Extraocular movements intact.     Conjunctiva/sclera: Conjunctivae normal.     Pupils: Pupils are equal, round, and reactive to light.  Cardiovascular:     Rate and Rhythm: Normal rate and regular rhythm.     Pulses: Normal pulses.     Heart sounds: Normal heart sounds.  Pulmonary:     Effort: Pulmonary effort is normal.     Breath sounds: Normal breath sounds. Decreased air movement present.  Abdominal:     General: Bowel sounds are normal.     Palpations: Abdomen is soft.     Tenderness: There is no abdominal tenderness.  Musculoskeletal:     Cervical back: Normal range of motion.  Lymphadenopathy:     Cervical: No cervical adenopathy.  Skin:    General: Skin is warm and dry.  Neurological:     General: No focal deficit present.     Mental Status: She is alert and oriented to person, place, and time.  Psychiatric:        Mood and Affect: Mood normal.        Behavior: Behavior normal.      UC Treatments / Results  Labs (all labs ordered are listed, but only abnormal results are displayed) Labs Reviewed  POC SARS CORONAVIRUS 2 AG -  ED    EKG   Radiology No results found.  Procedures Procedures (including critical care time)  Medications Ordered in UC Medications - No data to display  Initial Impression / Assessment and Plan / UC Course  I have reviewed the triage vital signs and the nursing  notes.  Pertinent labs & imaging results that were available during my care of the patient were reviewed by me and considered in my medical decision making (see chart for details).   On exam, lung sounds are clear throughout although patient does have decreased airway movement in the lower fields.  Room air sats were at 95%.  DuoNeb was administered to help with bronchoconstriction.  Solu-Medrol 125 mg IM also administered for bronchial inflammation.  Suspect patient may be experiencing a viral URI with cough with underlying asthma exacerbation.  Cough has been persistent during her visit.  Will start prednisone 40 mg for the next 5 days, fluticasone 50 micro nasal spray for nasal congestion and runny nose, and Promethazine  DM for her cough.  An albuterol  inhaler also prescribed for asthma exacerbation such as wheezing or shortness of breath.  Patient was given strict ER follow-up precautions, along with indications to follow-up with her PCP.  Patient was in agreement with this plan of care and verbalizes understanding.  All questions were answered.  Patient stable for discharge.  Work note was provided. Final Clinical Impressions(s) / UC Diagnoses   Final diagnoses:  None   Discharge Instructions   None    ED Prescriptions   None    PDMP not reviewed this encounter.   Hardy Lia, NP 08/28/23 [phone]

## 2023-08-28 NOTE — ED Triage Notes (Signed)
 Pt reports cough, congestion, fatigue, fever, chills x 6 days

## 2023-08-28 NOTE — Discharge Instructions (Addendum)
 Your COVID test was negative. Take medication as prescribed.  Start the prednisone on 08/29/2023.  Do not take ibuprofen  while you are taking the prednisone.  When you complete the prednisone, you can resume ibuprofen  as needed.  Please discontinue use of NyQuil.   Recommend over-the-counter Coricidin HBP which may be better to help lower your blood pressure. May take over-the-counter Tylenol  or ibuprofen  as needed for pain, fever, or general discomfort. Increase fluids and allow for plenty of rest. Recommend use of normal saline nasal spray throughout the day for nasal congestion and runny nose. For your cough, it may be helpful to use a humidifier in your bedroom at nighttime during sleep and to sleep elevated on pillows while symptoms persist. Go to the emergency department immediately if you experience wheezing, shortness of breath, difficulty breathing, or become unable to speak in a complete sentence. Follow-up with your primary care physician if symptoms fail to improve with this treatment. Follow-up as needed.

## 2023-12-31 ENCOUNTER — Ambulatory Visit
Admission: EM | Admit: 2023-12-31 | Discharge: 2023-12-31 | Disposition: A | Discharge status: Home / Self Care | Attending: Family Medicine | Admitting: Family Medicine

## 2023-12-31 DIAGNOSIS — R030 Elevated blood-pressure reading, without diagnosis of hypertension: Secondary | ICD-10-CM | POA: Diagnosis not present

## 2023-12-31 DIAGNOSIS — S39012A Strain of muscle, fascia and tendon of lower back, initial encounter: Secondary | ICD-10-CM | POA: Diagnosis not present

## 2023-12-31 MED ORDER — KETOROLAC TROMETHAMINE 30 MG/ML IJ SOLN
30.0000 mg | Freq: Once | INTRAMUSCULAR | Status: AC
  Administered 2023-12-31: 30 mg via INTRAMUSCULAR

## 2023-12-31 MED ORDER — TIZANIDINE HCL 4 MG PO CAPS
4.0000 mg | ORAL_CAPSULE | Freq: Three times a day (TID) | ORAL | 0 refills | Status: AC | PRN
Start: 2023-12-31 — End: ?

## 2023-12-31 NOTE — ED Provider Notes (Signed)
 [facility]    CSN: [id number] Arrival date & time: 12/31/23  0935      History   Chief Complaint No chief complaint on file.   HPI Catherine Hunter is a 48 y.o. female.   Patient presenting today with 5-day history of right sided low back pain after trying to help her husband out of the tub.  Think she may have pulled a muscle while doing this.  Denies fever, chills, saddle anesthesias, bowel or bladder incontinence, numbness tingling or weakness down the leg.  So far trying ibuprofen  with mild temporary benefit.  Medical history for For hypertension on amlodipine , open-angle glaucoma followed by ophthalmology.    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   Depression with anxiety 04/10/2021   Tobacco abuse 04/10/2021   Iron deficiency anemia 10/01/2019    Past Surgical History:  Procedure Laterality Date   APPENDECTOMY     AT SAME TIME AS COLON SURGERY   CESAREAN SECTION  1997   COLON SURGERY     BOWEL BLOCKAGE   CYSTOSCOPY N/A 03/31/2020   Procedure: CYSTOSCOPY;  Surgeon: Leonce Garnette BIRCH, MD;  Location: [facility];  Service: Gynecology;  Laterality: N/A;   ECTOPIC PREGNANCY SURGERY  2000   x2   HERNIA REPAIR     LAPAROSCOPIC GASTRIC SLEEVE RESECTION  07/20/2014   LAPAROTOMY N/A 04/29/2016   Procedure: EXPLORATORY LAPAROTOMY and appendectomy;  Surgeon: Charlie FORBES Fell, MD;  Location: [facility];  Service: General;  Laterality: N/A;   TOTAL LAPAROSCOPIC HYSTERECTOMY WITH SALPINGECTOMY Bilateral 03/31/2020   Procedure: TOTAL  LAPAROSCOPIC HYSTERECTOMY WITH BILATERAL SALPINGECTOMY;  Surgeon: Leonce Garnette BIRCH, MD;  Location: [facility];  Service: Gynecology;  Laterality: Bilateral;    OB History     Gravida  5   Para  1   Term      Preterm      AB  2   Living         SAB      IAB      Ectopic  2   Multiple      Live Births               Home Medications    Prior to Admission medications   Medication Sig Start Date End Date Taking? Authorizing Provider  tiZANidine  (ZANAFLEX ) 4 MG capsule Take 1 capsule (4 mg total) by mouth 3 (three) times daily as needed for muscle spasms. Do not drink alcohol or drive while taking this medication.  May cause drowsiness. 12/31/23  Yes Stuart Vernell Norris, PA-C  albuterol  (VENTOLIN  HFA) 108 [phone] Base) MCG/ACT inhaler Inhale 2 puffs into the lungs every 6 (six) hours as needed. 08/28/23   Leath-Warren, Etta PARAS, NP  amLODipine  (NORVASC ) 5 MG tablet Take 1 tablet (5 mg total) by mouth daily. 02/21/21   Mortenson, Ashley, MD  ferrous sulfate  (FERROUSUL) 325 (65 FE) MG tablet Take 1 tablet (325 mg total) by mouth daily with breakfast. Patient not taking: Reported on 07/09/2022 11/26/19  Jacobo Evalene PARAS, MD  FLUoxetine  (PROZAC ) 10 MG tablet Take 1 tablet (10 mg total) by mouth daily. Patient not taking: Reported on 07/09/2022 04/10/21   Tobie Suzzane POUR, MD  fluticasone  (FLONASE ) 50 MCG/ACT nasal spray Place 2 sprays into both nostrils daily. 08/28/23   Leath-Warren, Etta PARAS, NP  hydrOXYzine  (VISTARIL ) 25 MG capsule Take 1 capsule (25 mg total) by mouth every 8 (eight) hours as needed. 04/10/21   Patel, Rutwik K, MD  Multiple Vitamins-Minerals (MULTIVITAMIN WITH MINERALS) tablet Take 1 tablet by mouth 4 (four) times a week.     [provider]  ondansetron  (ZOFRAN  ODT) 8 MG disintegrating tablet Take 1 tablet (8 mg total) by mouth every 8 (eight) hours as needed for nausea or vomiting. 02/21/21   Mortenson, Ashley, MD  promethazine -dextromethorphan  (PROMETHAZINE -DM) 6.25-15 MG/5ML syrup Take 5 mLs by mouth 4 (four) times daily as needed. 08/28/23   Leath-Warren, Etta PARAS, NP  telmisartan  (MICARDIS ) 20 MG tablet Take 1 tablet (20 mg total) by mouth daily. Patient not taking: Reported on 07/09/2022 04/10/21   Tobie Suzzane POUR, MD    Family History Family History  Problem Relation Age of Onset   Hypertension Mother    Hypertension Father    Throat cancer Father    Heart disease Father    Hypertension Sister    Hypertension Sister    Uterine cancer Neg Hx    Ovarian cancer Neg Hx     Social History Social History   Tobacco Use   Smoking status: Some Days    Current packs/day: 0.25    Average packs/day: 0.3 packs/day for 4.0 years (1.0 ttl pk-yrs)    Types: Cigars, Cigarettes   Smokeless tobacco: Never  Vaping Use   Vaping status: Never Used  Substance Use Topics   Alcohol use: Yes    Comment: 2 drinks wine/ weekly   Drug use: No     Allergies   Hydrocodone, Oxycodone , and Amoxicillin    Review of Systems Review of Systems Per HPI  Physical Exam Triage Vital Signs ED Triage Vitals  Encounter Vitals Group     BP 12/31/23 1024 (!) 200/120     Girls Systolic BP Percentile --      Girls Diastolic BP Percentile --      Boys Systolic BP Percentile --      Boys Diastolic BP Percentile --      Pulse Rate 12/31/23 1024 84     Resp 12/31/23 1024 18     Temp 12/31/23 1024 98.6 F (37 C)     Temp Source 12/31/23 1024 Oral     SpO2 12/31/23 1024 94 %     Weight --      Height --      Head Circumference --      Peak Flow --      Pain Score 12/31/23 1022 6     Pain Loc --      Pain Education --      Exclude from Growth Chart --    No data found.  Updated Vital Signs BP (!) 200/120 (BP Location: Left Arm) Comment: second attemp first attempt 210/123  Pulse 84   Temp 98.6 F (37 C) (Oral)   Resp 18   LMP 03/13/2020 (Approximate)   SpO2 94%   Visual Acuity Right Eye Distance:   Left Eye Distance:    Bilateral Distance:    Right Eye Near:   Left Eye Near:    Bilateral Near:  Physical Exam Vitals and nursing note reviewed.  Constitutional:      Appearance: Normal appearance. She is not ill-appearing.  HENT:     Head: Atraumatic.  Eyes:     Extraocular Movements: Extraocular movements intact.     Conjunctiva/sclera: Conjunctivae normal.  Cardiovascular:     Rate and Rhythm: Normal rate and regular rhythm.     Heart sounds: Normal heart sounds.  Pulmonary:     Effort: Pulmonary effort is normal.     Breath sounds: Normal breath sounds.  Musculoskeletal:        General: Tenderness present. No swelling. Normal range of motion.     Cervical back: Normal range of motion and neck supple.     Comments: No midline spinal tenderness to palpation diffusely.  Negative straight leg raise bilateral lower extremity.  Normal gait and range of motion though antalgic.  Tenderness to palpation to the right lateral lumbar musculature  Skin:    General: Skin is warm and dry.     Findings: No bruising or erythema.  Neurological:     Mental Status: She is alert and oriented to person, place, and time.  Psychiatric:        Mood and Affect: Mood normal.        Thought Content: Thought content normal.        Judgment: Judgment normal.      UC Treatments / Results  Labs (all labs ordered are listed, but only abnormal results are displayed) Labs Reviewed - No data to display  EKG   Radiology No results found.  Procedures Procedures (including critical care time)  Medications Ordered in UC Medications  ketorolac  (TORADOL ) 30 MG/ML injection 30 mg (30 mg Intramuscular Given 12/31/23 1201)    Initial Impression / Assessment and Plan / UC Course  I have reviewed the triage vital signs and the nursing notes.  Pertinent labs & imaging results that were available during my care of the patient were reviewed by me and considered in my medical decision making (see chart for details).      Significantly elevated blood pressure reading in triage even after recheck.  She notes that she keeps a close eye on her blood pressures at home and at work and they typically run around 130 or under, states it typically reads much higher when she is at doctors appointments.  She is compliant with her amlodipine  and denies any chest pain, shortness of breath, headaches, dizziness.  Discussed to continue checking home blood pressure readings and follow-up with primary care as soon as possible if not improving.  Regarding her current lumbar strain, treat with IM Toradol , Zanaflex , heat, soft, stretches, rest.  Return for worsening symptoms.  Final Clinical Impressions(s) / UC Diagnoses   Final diagnoses:  Strain of lumbar region, initial encounter  Elevated blood pressure reading     Discharge Instructions      We have given you a shot of an anti-inflammatory pain medication called Toradol  today.  Avoid taking any over-the-counter anti-inflammatories to include ibuprofen , Aleve, Advil  for the next 48 hours as these medications are similar.  You may continue taking Tylenol  as needed for breakthrough pain.  I have also prescribed a muscle relaxer to the pharmacy and you may use heat, massage, stretches, rest.  Regarding your significantly elevated blood pressure reading today, continue your medication as prescribed by your primary care and closely monitor and log your home readings.  If not improving over the next day or so call your  primary care for a follow-up appointment     ED Prescriptions     Medication Sig Dispense Auth. Provider   tiZANidine  (ZANAFLEX ) 4 MG capsule Take 1 capsule (4 mg total) by mouth 3 (three) times daily as needed for muscle spasms. Do not drink alcohol or drive while taking this medication.  May cause drowsiness. 15 capsule Stuart Vernell Norris, [place]      PDMP not reviewed this encounter.   Stuart Vernell Norris, [place] 12/31/23 1319

## 2023-12-31 NOTE — ED Triage Notes (Signed)
 Pulled muscle in lower back that radiates to her right side after attempting to lift husband out of tub x 5 days

## 2023-12-31 NOTE — Discharge Instructions (Addendum)
 We have given you a shot of an anti-inflammatory pain medication called Toradol  today.  Avoid taking any over-the-counter anti-inflammatories to include ibuprofen , Aleve, Advil  for the next 48 hours as these medications are similar.  You may continue taking Tylenol  as needed for breakthrough pain.  I have also prescribed a muscle relaxer to the pharmacy and you may use heat, massage, stretches, rest.  Regarding your significantly elevated blood pressure reading today, continue your medication as prescribed by your primary care and closely monitor and log your home readings.  If not improving over the next day or so call your primary care for a follow-up appointment
